# Patient Record
Sex: Male | Born: 1970 | Race: White | Hispanic: No | Marital: Married | Smoking: Former smoker
Health system: Southern US, Community
[De-identification: ages and names within clinical notes are randomized; demographics above are authoritative.]

## PROBLEM LIST (undated history)

## (undated) DIAGNOSIS — G8929 Other chronic pain: Secondary | ICD-10-CM

## (undated) DIAGNOSIS — G4730 Sleep apnea, unspecified: Secondary | ICD-10-CM

## (undated) DIAGNOSIS — M545 Low back pain: Secondary | ICD-10-CM

## (undated) HISTORY — PX: TYMPANOSTOMY TUBE PLACEMENT: SHX32

## (undated) HISTORY — PX: HERNIA REPAIR: SHX51

---

## 2004-06-02 ENCOUNTER — Encounter: Admission: RE | Admit: 2004-06-02 | Discharge: 2004-06-02 | Payer: Self-pay | Admitting: Family Medicine

## 2010-01-18 ENCOUNTER — Ambulatory Visit: Payer: Self-pay | Admitting: Internal Medicine

## 2010-01-19 ENCOUNTER — Telehealth (INDEPENDENT_AMBULATORY_CARE_PROVIDER_SITE_OTHER): Payer: Self-pay | Admitting: *Deleted

## 2010-12-15 NOTE — Assessment & Plan Note (Signed)
Summary: NEW PT EST // RS/pt rsc/cjr   Vital Signs:  Patient profile:   40 year old male Height:      73.5 inches Weight:      221 pounds BMI:     28.87 Temp:     98.6 degrees F oral BP sitting:   100 / 70  (right arm) Cuff size:   regular  Vitals Entered By: Duard Brady LPN (January 18, 6577 2:58 PM) CC: new pt to establish - c/o lumps under skin (L) mid back and (L) upper abd areas Is Patient Diabetic? No   CC:  new pt to establish - c/o lumps under skin (L) mid back and (L) upper abd areas.  History of Present Illness: 40 year old patient who is in today to establish what our practice.; he has enjoyed excellent health and has had some mild dermatologic concerns.  His past medical history is unremarkable.  Hehas had no hospital admissions.  He takes no chronic medications.  Preventive Screening-Counseling & Management  Alcohol-Tobacco     Smoking Status: never  Allergies (verified): 1)  ! Penicillin  Past History:  Past Medical History: unremarkable  Past Surgical History: tympanoplasty tubes as a child  Family History: Reviewed history and no changes required. father age 57.  History colonic polyps on chronic Coumadin mother, age 44, history of lupus also on chronic Coumadin one brother in good health one sister in good health-status post resection of a benign brain tumor at an early age  Social History: Reviewed history and no changes required. 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  The patient denies anorexia, fever, weight loss, weight gain, vision loss, decreased hearing, hoarseness, chest pain, syncope, dyspnea on exertion, peripheral edema, prolonged cough, headaches, hemoptysis, abdominal pain, melena, hematochezia, severe  indigestion/heartburn, hematuria, incontinence, genital sores, muscle weakness, transient blindness, difficulty walking, depression, unusual weight change, abnormal bleeding, enlarged lymph nodes, angioedema, breast masses, and testicular masses.    Physical Exam  General:  overweight-appearing.  blood pressure 100/70 Head:  Normocephalic and atraumatic without obvious abnormalities. No apparent alopecia or balding. Eyes:  No corneal or conjunctival inflammation noted. EOMI. Perrla. Funduscopic exam benign, without hemorrhages, exudates or papilledema. Vision grossly normal. Ears:  External ear exam shows no significant lesions or deformities.  Otoscopic examination reveals clear canals, tympanic membranes are intact bilaterally without bulging, retraction, inflammation or discharge. Hearing is grossly normal bilaterally. Nose:  External nasal examination shows no deformity or inflammation. Nasal mucosa are pink and moist without lesions or exudates. Mouth:  Oral mucosa and oropharynx without lesions or exudates.  Teeth in good repair. Neck:  No deformities, masses, or tenderness noted. Chest Wall:  No deformities, masses, tenderness or gynecomastia noted. Breasts:  No masses or gynecomastia noted Lungs:  Normal respiratory effort, chest expands symmetrically. Lungs are clear to auscultation, no crackles or wheezes. Heart:  Normal rate and regular rhythm. S1 and S2 normal without gallop, murmur, click, rub or other extra sounds. Abdomen:  Bowel sounds positive,abdomen soft and non-tender without masses, organomegaly or hernias noted. Genitalia:  Testes bilaterally descended without nodularity, tenderness or masses. No scrotal masses or lesions. No penis lesions or urethral discharge. Msk:  No deformity or scoliosis noted of thoracic or lumbar spine.  Pulses:  R and L carotid,radial,femoral,dorsalis pedis and posterior tibial pulses are full and equal bilaterally Extremities:  No clubbing,  cyanosis, edema, or deformity noted with normal full range of motion of all joints.   Neurologic:  No cranial nerve deficits noted. Station and gait are normal. Plantar reflexes are down-going bilaterally. DTRs are symmetrical throughout. Sensory, motor and coordinative functions appear intact. Skin:  a pigmented vascular nevus in the left anterior chest wall area near the costal margin; a few scattered subcutaneous nodules, consistent with small lipomas or possibly sebaceous cysts Axillary Nodes:  No palpable lymphadenopathy Inguinal Nodes:  No significant adenopathy Psych:  Cognition and judgment appear intact. Alert and cooperative with normal attention span and concentration. No apparent delusions, illusions, hallucinations   Impression & Recommendations:  Problem # 1:  Preventive Health Care (ICD-V70.0)  Orders: Urology Referral (Urology)  Patient Instructions: 1)  It is important that you exercise regularly at least 20 minutes 5 times a week. If you develop chest pain, have severe difficulty breathing, or feel very tired , stop exercising immediately and seek medical attention. 2)  You need to lose weight. Consider a lower calorie diet and regular exercise.  3)  return at age 7 for an annual exam

## 2010-12-15 NOTE — Progress Notes (Signed)
Summary: call no answer 3-9  Phone Note Call from Patient Call back at [phone]   Summary of Call: His wife says she took a call that he is to schedule a followup with Dr. Kirtland Bouchard.  What has changed?  He was told when he was here that he didn't need to come back until age 40. Initial call taken by: Rudy Jew, RN,  January 19, 2010 3:23 PM  Follow-up for Phone Call        ?phone off.  Call does not complete. Rudy Jew, RN  January 20, 2010 12:05 PM Mailbox full.  Entered office # to return call. Rudy Jew, RN  January 20, 2010 2:50 PM      ????? Wife is correct; ROV age 59 or as needed

## 2012-01-18 ENCOUNTER — Other Ambulatory Visit: Payer: Self-pay | Admitting: Internal Medicine

## 2012-01-25 ENCOUNTER — Ambulatory Visit
Admission: RE | Admit: 2012-01-25 | Discharge: 2012-01-25 | Disposition: A | Discharge status: Home / Self Care | Payer: 59 | Source: Ambulatory Visit | Attending: Internal Medicine | Admitting: Internal Medicine

## 2012-02-15 ENCOUNTER — Other Ambulatory Visit: Payer: Self-pay | Admitting: Internal Medicine

## 2012-02-15 DIAGNOSIS — R1011 Right upper quadrant pain: Secondary | ICD-10-CM

## 2012-02-20 ENCOUNTER — Other Ambulatory Visit: Payer: 59

## 2012-02-22 ENCOUNTER — Ambulatory Visit
Admission: RE | Admit: 2012-02-22 | Discharge: 2012-02-22 | Disposition: A | Discharge status: Home / Self Care | Payer: 59 | Source: Ambulatory Visit | Attending: Internal Medicine | Admitting: Internal Medicine

## 2012-02-22 DIAGNOSIS — R1011 Right upper quadrant pain: Secondary | ICD-10-CM

## 2012-02-22 MED ORDER — IOHEXOL 300 MG/ML  SOLN
125.0000 mL | Freq: Once | INTRAMUSCULAR | Status: AC | PRN
  Administered 2012-02-22: 125 mL via INTRAVENOUS

## 2012-11-30 ENCOUNTER — Encounter (INDEPENDENT_AMBULATORY_CARE_PROVIDER_SITE_OTHER): Payer: Self-pay

## 2012-12-04 ENCOUNTER — Encounter (INDEPENDENT_AMBULATORY_CARE_PROVIDER_SITE_OTHER): Payer: Self-pay | Admitting: Surgery

## 2012-12-04 ENCOUNTER — Ambulatory Visit (INDEPENDENT_AMBULATORY_CARE_PROVIDER_SITE_OTHER): Payer: 59 | Admitting: Surgery

## 2012-12-04 VITALS — BP 130/68 | HR 80 | Temp 97.4°F | Resp 16 | Ht 74.0 in | Wt 240.8 lb

## 2012-12-04 DIAGNOSIS — K420 Umbilical hernia with obstruction, without gangrene: Secondary | ICD-10-CM | POA: Insufficient documentation

## 2012-12-04 NOTE — Patient Instructions (Signed)
[facility]  HERNIA REPAIR POST OP INSTRUCTIONS  Always review your discharge instruction sheet given to you by the facility where your surgery was performed.  1. A  prescription for pain medication may be given to you upon discharge.  Take your pain medication as prescribed.  If narcotic pain medicine is not needed, then you may take acetaminophen (Tylenol) or ibuprofen (Advil) as needed.  2. Take your usually prescribed medications unless otherwise directed.  3. If you need a refill on your pain medication, please contact your pharmacy.  They will contact our office to request authorization. Prescriptions will not be filled after 5 pm daily or on weekends.  4. You should follow a light diet the first 24 hours after arrival home, such as soup and crackers or toast.  Be sure to include plenty of fluids daily.  Resume your normal diet the day after surgery.  5. Most patients will experience some swelling and bruising around the surgical site.  Ice packs and reclining will help.  Swelling and bruising can take several days to resolve.   6. It is common to experience some constipation if taking pain medication after surgery.  Increasing fluid intake and taking a stool softener (such as Colace) will usually help or prevent this problem from occurring.  A mild laxative (Milk of Magnesia or Miralax) should be taken according to package directions if there are no bowel movements after 48 hours.  7. Unless discharge instructions indicate otherwise, you may remove your bandages 24-48 hours after surgery, and you may shower at that time.  You may have steri-strips (small skin tapes) in place directly over the incision.  These strips should be left on the skin for 7-10 days.  If your surgeon used skin glue on the incision, you may shower in 24 hours.  The glue will flake off over the next 2-3 weeks.  Any sutures or staples will be removed at the office during your follow-up  visit.  8. ACTIVITIES:  You may resume regular (light) daily activities beginning the next day-such as daily self-care, walking, climbing stairs-gradually increasing activities as tolerated.  You may have sexual intercourse when it is comfortable.  Refrain from any heavy lifting or straining until approved by your doctor.  You may drive when you are no longer taking prescription pain medication, you can comfortably wear a seatbelt, and you can safely maneuver your car and apply brakes.  9. You should see your doctor in the office for a follow-up appointment approximately 2-3 weeks after your surgery.  Make sure that you call for this appointment within a day or two after you arrive home to insure a convenient appointment time. 10.   WHEN TO CALL YOUR DOCTOR: 1. Fever greater than 101.0 2. Inability to urinate 3. Persistent nausea and/or vomiting 4. Extreme swelling or bruising 5. Continued bleeding from incision 6. Increased pain, redness, or drainage from the incision  The clinic staff is available to answer your questions during regular business hours.  Please don't hesitate to call and ask to speak to one of the nurses for clinical concerns.  If you have a medical emergency, go to the nearest emergency room or call 911.  A surgeon from [facility] is always on call for the hospital.   [facility] [address], [address], [place], [place]  [address]  [phone] ? [phone] ? FAX [phone]  [email or web address]   

## 2012-12-04 NOTE — Progress Notes (Signed)
General Surgery [facility]  Chief Complaint  Patient presents with  . New Evaluation    evaluate possible umbilical hernia - referral from Dr. Robley Fries    HISTORY: Patient is a 42 year old white male referred by his primary care physician for evaluation of umbilical pain and suspected umbilical hernia. Patient notes that he has had pain for approximately 3 years. It is essentially constant. It becomes severe with palpation or manipulation. Patient denies any history of trauma or lifting injury. He has had no prior abdominal surgery. He denies any changes in bowel habits. He denies any drainage from the umbilicus. Patient does note a bulge just above the level of the umbilicus.  Past Medical History  Diagnosis Date  . Back pain      Current Outpatient Prescriptions  Medication Sig Dispense Refill  . ibuprofen (ADVIL,MOTRIN) 200 MG tablet Take 200 mg by mouth every 6 (six) hours as needed.         Allergies  Allergen Reactions  . Penicillins      Family History  Problem Relation Age of Onset  . Cancer Paternal Grandmother     lung  . Cancer Paternal Grandfather     colon     History   Social History  . Marital Status: Married    Spouse Name: N/A    Number of Children: N/A  . Years of Education: N/A   Social History Main Topics  . Smoking status: Former Games developer  . Smokeless tobacco: Never Used  . Alcohol Use: 6.0 oz/week    10 Glasses of wine per week  . Drug Use: No  . Sexually Active: None   Other Topics Concern  . None   Social History Narrative  . None     REVIEW OF SYSTEMS - PERTINENT POSITIVES ONLY: Moderate pain, exacerbated by manipulation. Denies drainage. Denies changes in bowel habits. Visible bulge present on occasion.  EXAM: Filed Vitals:   12/04/12 1335  BP: 130/68  Pulse: 80  Temp: 97.4 F (36.3 C)  Resp: 16    HEENT: normocephalic; pupils equal and reactive; sclerae clear; dentition good; mucous membranes  moist NECK:  symmetric on extension; no palpable anterior or posterior cervical lymphadenopathy; no supraclavicular masses; no tenderness CHEST: clear to auscultation bilaterally without rales, rhonchi, or wheezes CARDIAC: regular rate and rhythm without significant murmur; peripheral pulses are full ABDOMEN: soft without distension; bowel sounds present; no mass; no hepatosplenomegaly; deformity at the umbilicus with palpable incarcerated adipose tissue consistent with incarcerated umbilical hernia; moderate tenderness to palpation; moderate rectus diastases; small hemangioma left upper quadrant less than 5 mm in diameter EXT:  non-tender without edema; no deformity NEURO: no gross focal deficits; no sign of tremor   LABORATORY RESULTS: See [facility] (CHL-Epic) for most recent results   RADIOLOGY RESULTS: See [facility] (CHL-Epic) for most recent results   IMPRESSION: #1 incarcerated umbilical hernia containing omentum, symptomatic #2  5 mm hemangioma left upper quadrant abdominal wall  PLAN: I discussed with the patient the indications for repair of his umbilical hernia. I provided him with written literature to review. We discussed the use of prosthetic mesh. We discussed or strictures on his activities following the procedure. I explained to him that there is a less than 5% chance of recurrence. We discussed potential complications including the risk of infection. He understands and wishes to proceed. He would like to have the hemangioma on the abdominal wall excised concurrently. We will schedule him for outpatient  surgery in the near future.  The risks and benefits of the procedure have been discussed at length with the patient.  The patient understands the proposed procedure, potential alternative treatments, and the course of recovery to be expected.  All of the patient's questions have been answered at this time.  The patient wishes to proceed with surgery.  [name], MD, FACS General & Endocrine Surgery [facility]   Visit Diagnoses: 1. Umbilical hernia, incarcerated     Primary Care Physician: Pearla Dubonnet, MD

## 2014-06-03 ENCOUNTER — Encounter (INDEPENDENT_AMBULATORY_CARE_PROVIDER_SITE_OTHER): Payer: Self-pay | Admitting: Surgery

## 2014-06-03 ENCOUNTER — Ambulatory Visit (INDEPENDENT_AMBULATORY_CARE_PROVIDER_SITE_OTHER): Payer: 59 | Admitting: Surgery

## 2014-06-03 VITALS — BP 136/80 | HR 80 | Temp 97.1°F | Ht 74.0 in | Wt 244.0 lb

## 2014-06-03 DIAGNOSIS — K420 Umbilical hernia with obstruction, without gangrene: Secondary | ICD-10-CM

## 2014-06-03 NOTE — Progress Notes (Signed)
General Surgery [facility]  Chief Complaint  Patient presents with  . Follow-up    incarcerated umbilical hernia - evaluated in Jan 2014 - referral from Dr. Robley FriesNevill Gates    HISTORY: Patient is a 43 year old male previously evaluated for incarcerated umbilical hernia. Due to a series of events he never came to surgical repair. Patient returns today for assessment and scheduling of his procedure.  PERTINENT REVIEW OF SYSTEMS: Over the past year the patient has noted gradual enlargement of the hernia with more frequent discomfort.  EXAM: HEENT: normocephalic; pupils equal and reactive; sclerae clear; dentition good; mucous membranes moist NECK:  No palpable masses in the thyroid bed; symmetric on extension; no palpable anterior or posterior cervical lymphadenopathy; no supraclavicular masses; no tenderness CHEST: clear to auscultation bilaterally without rales, rhonchi, or wheezes CARDIAC: regular rate and rhythm without significant murmur; peripheral pulses are full ABD:  Soft without distention; obvious umbilical hernia, not reducible, tender to manipulation; there is a small hemangioma at the left costal margin in the left upper quadrant of the abdomen measuring approximately 4 mm in diameter EXT:  non-tender without edema; no deformity NEURO: no gross focal deficits; no sign of tremor   IMPRESSION: #1 incarcerated umbilical hernia #2 hemangioma left costal margin  PLAN: Patient with like to proceed with umbilical hernia repair. We discussed repair with mesh. We discussed restrictions on his activities following the procedure. We discussed the possibility of recurrence being approximately 5%. This can be done as an outpatient surgical procedure.  Patient would also like the small hemangioma at the left costal margin excised. We can do this concurrently.  The risks and benefits of the procedure have been discussed at length with the patient.  The patient  understands the proposed procedure, potential alternative treatments, and the course of recovery to be expected.  All of the patient's questions have been answered at this time.  The patient wishes to proceed with surgery.  Velora Hecklerodd M. Talik Casique, MD, [facility] Office: [phone]  Visit Diagnoses: 1. Umbilical hernia, incarcerated

## 2014-06-03 NOTE — Patient Instructions (Signed)
[facility]  HERNIA REPAIR POST OP INSTRUCTIONS  Always review your discharge instruction sheet given to you by the facility where your surgery was performed.  1. A  prescription for pain medication may be given to you upon discharge.  Take your pain medication as prescribed.  If narcotic pain medicine is not needed, then you may take acetaminophen (Tylenol) or ibuprofen (Advil) as needed.  2. Take your usually prescribed medications unless otherwise directed.  3. If you need a refill on your pain medication, please contact your pharmacy.  They will contact our office to request authorization. Prescriptions will not be filled after 5 pm daily or on weekends.  4. You should follow a light diet the first 24 hours after arrival home, such as soup and crackers or toast.  Be sure to include plenty of fluids daily.  Resume your normal diet the day after surgery.  5. Most patients will experience some swelling and bruising around the surgical site.  Ice packs and reclining will help.  Swelling and bruising can take several days to resolve.   6. It is common to experience some constipation if taking pain medication after surgery.  Increasing fluid intake and taking a stool softener (such as Colace) will usually help or prevent this problem from occurring.  A mild laxative (Milk of Magnesia or Miralax) should be taken according to package directions if there are no bowel movements after 48 hours.  7. Unless discharge instructions indicate otherwise, you may remove your bandages 24-48 hours after surgery, and you may shower at that time.  You may have steri-strips (small skin tapes) in place directly over the incision.  These strips should be left on the skin for 7-10 days.  If your surgeon used skin glue on the incision, you may shower in 24 hours.  The glue will flake off over the next 2-3 weeks.  Any sutures or staples will be removed at the office during your follow-up  visit.  8. ACTIVITIES:  You may resume regular (light) daily activities beginning the next day-such as daily self-care, walking, climbing stairs-gradually increasing activities as tolerated.  You may have sexual intercourse when it is comfortable.  Refrain from any heavy lifting or straining until approved by your doctor.  You may drive when you are no longer taking prescription pain medication, you can comfortably wear a seatbelt, and you can safely maneuver your car and apply brakes.  9. You should see your doctor in the office for a follow-up appointment approximately 2-3 weeks after your surgery.  Make sure that you call for this appointment within a day or two after you arrive home to insure a convenient appointment time. 10.   WHEN TO CALL YOUR DOCTOR: 1. Fever greater than 101.0 2. Inability to urinate 3. Persistent nausea and/or vomiting 4. Extreme swelling or bruising 5. Continued bleeding from incision 6. Increased pain, redness, or drainage from the incision  The clinic staff is available to answer your questions during regular business hours.  Please don't hesitate to call and ask to speak to one of the nurses for clinical concerns.  If you have a medical emergency, go to the nearest emergency room or call 911.  A surgeon from [facility] is always on call for the hospital.   [facility] [address], [address], [place], [place]  [address]  [phone] ? [phone] ? FAX [phone]  [email or web address]   

## 2014-09-01 ENCOUNTER — Emergency Department (HOSPITAL_COMMUNITY)
Admission: EM | Admit: 2014-09-01 | Discharge: 2014-09-01 | Disposition: A | Discharge status: Home / Self Care | Payer: No Typology Code available for payment source | Attending: Emergency Medicine | Admitting: Emergency Medicine

## 2014-09-01 ENCOUNTER — Encounter (HOSPITAL_COMMUNITY): Payer: Self-pay | Admitting: Emergency Medicine

## 2014-09-01 ENCOUNTER — Emergency Department (HOSPITAL_COMMUNITY): Payer: No Typology Code available for payment source

## 2014-09-01 DIAGNOSIS — Z87891 Personal history of nicotine dependence: Secondary | ICD-10-CM | POA: Diagnosis not present

## 2014-09-01 DIAGNOSIS — Z8639 Personal history of other endocrine, nutritional and metabolic disease: Secondary | ICD-10-CM | POA: Diagnosis not present

## 2014-09-01 DIAGNOSIS — R079 Chest pain, unspecified: Secondary | ICD-10-CM | POA: Diagnosis present

## 2014-09-01 DIAGNOSIS — R0789 Other chest pain: Secondary | ICD-10-CM | POA: Diagnosis not present

## 2014-09-01 DIAGNOSIS — Z880 Allergy status to penicillin: Secondary | ICD-10-CM | POA: Diagnosis not present

## 2014-09-01 DIAGNOSIS — E669 Obesity, unspecified: Secondary | ICD-10-CM | POA: Insufficient documentation

## 2014-09-01 LAB — CBC WITH DIFFERENTIAL/PLATELET
BASOS PCT: 0 % (ref 0–1)
Basophils Absolute: 0 10*3/uL (ref 0.0–0.1)
EOS ABS: 0.2 10*3/uL (ref 0.0–0.7)
Eosinophils Relative: 3 % (ref 0–5)
HEMATOCRIT: 43 % (ref 39.0–52.0)
HEMOGLOBIN: 15.4 g/dL (ref 13.0–17.0)
LYMPHS ABS: 1.9 10*3/uL (ref 0.7–4.0)
Lymphocytes Relative: 27 % (ref 12–46)
MCH: 32 pg (ref 26.0–34.0)
MCHC: 35.8 g/dL (ref 30.0–36.0)
MCV: 89.2 fL (ref 78.0–100.0)
Monocytes Absolute: 0.5 10*3/uL (ref 0.1–1.0)
Monocytes Relative: 8 % (ref 3–12)
NEUTROS ABS: 4.3 10*3/uL (ref 1.7–7.7)
Neutrophils Relative %: 62 % (ref 43–77)
Platelets: 248 10*3/uL (ref 150–400)
RBC: 4.82 MIL/uL (ref 4.22–5.81)
RDW: 12.1 % (ref 11.5–15.5)
WBC: 6.9 10*3/uL (ref 4.0–10.5)

## 2014-09-01 LAB — BASIC METABOLIC PANEL
ANION GAP: 11 (ref 5–15)
BUN: 18 mg/dL (ref 6–23)
CO2: 26 meq/L (ref 19–32)
Calcium: 9.1 mg/dL (ref 8.4–10.5)
Chloride: 102 mEq/L (ref 96–112)
Creatinine, Ser: 1.11 mg/dL (ref 0.50–1.35)
GFR, EST NON AFRICAN AMERICAN: 80 mL/min — AB (ref >90)
Glucose, Bld: 103 mg/dL — ABNORMAL HIGH (ref 70–99)
Potassium: 4.2 mEq/L (ref 3.7–5.3)
Sodium: 139 mEq/L (ref 137–147)

## 2014-09-01 LAB — I-STAT TROPONIN, ED
Troponin i, poc: 0 ng/mL (ref 0.00–0.08)
Troponin i, poc: 0 ng/mL (ref 0.00–0.08)

## 2014-09-01 LAB — D-DIMER, QUANTITATIVE (NOT AT ARMC)

## 2014-09-01 MED ORDER — KETOROLAC TROMETHAMINE 30 MG/ML IJ SOLN
30.0000 mg | Freq: Once | INTRAMUSCULAR | Status: AC
  Administered 2014-09-01: 30 mg via INTRAVENOUS
  Filled 2014-09-01: qty 1

## 2014-09-01 NOTE — ED Provider Notes (Signed)
CSN: [id number]     Arrival date & time 09/01/14  1002 History   First MD Initiated Contact with Patient 09/01/14 1005     Chief Complaint  Patient presents with  . Chest Pain     (Consider location/radiation/quality/duration/timing/severity/associated sxs/prior Treatment) Patient is a 43 y.o. male presenting with chest pain.  Chest Pain Pain location:  Substernal area Pain quality: sharp   Pain radiates to:  Does not radiate Pain radiates to the back: no   Pain severity:  Moderate Onset quality:  Unable to specify Duration:  3 hours Timing:  Constant Progression:  Unchanged Chronicity:  New Context: breathing   Relieved by:  None tried Worsened by:  Deep breathing Ineffective treatments:  None tried Associated symptoms: no abdominal pain, no cough, no diaphoresis, no fatigue, no fever, no headache, no heartburn, no lower extremity edema, no nausea, no shortness of breath, no syncope and not vomiting   Risk factors: high cholesterol, male sex and obesity   Risk factors: no diabetes mellitus, no hypertension and no prior DVT/PE     Past Medical History  Diagnosis Date  . Back pain    Past Surgical History  Procedure Laterality Date  . Tympanostomy tube placement     Family History  Problem Relation Age of Onset  . Cancer Paternal Grandmother     lung  . Cancer Paternal Grandfather     colon   History  Substance Use Topics  . Smoking status: Former Games developermoker  . Smokeless tobacco: Never Used  . Alcohol Use: 6.0 oz/week    10 Glasses of wine per week    Review of Systems  Constitutional: Negative for fever, chills, diaphoresis and fatigue.  HENT: Negative for congestion and sore throat.   Eyes: Negative for visual disturbance.  Respiratory: Negative for cough, shortness of breath and wheezing.   Cardiovascular: Positive for chest pain. Negative for syncope.  Gastrointestinal: Negative for heartburn, nausea, vomiting, abdominal pain, diarrhea and constipation.   Genitourinary: Negative for dysuria and difficulty urinating.  Musculoskeletal: Negative for arthralgias and myalgias.  Skin: Negative for wound.  Neurological: Negative for syncope and headaches.  Psychiatric/Behavioral: Negative for behavioral problems.  All other systems reviewed and are negative.     Allergies  Penicillins  Home Medications   Prior to Admission medications   Medication Sig Start Date End Date Taking? Authorizing Provider  cetirizine (ZYRTEC) 10 MG tablet Take 10 mg by mouth daily as needed for allergies.   Yes Historical Provider, MD  ibuprofen (ADVIL,MOTRIN) 200 MG tablet Take 200 mg by mouth every 6 (six) hours as needed for fever, headache or mild pain.    Yes Historical Provider, MD   BP 128/76  Pulse 71  Temp(Src) 97.3 F (36.3 C) (Oral)  Resp 20  Ht 6' 1.5" (1.867 m)  Wt 242 lb (109.77 kg)  BMI 31.49 kg/m2  SpO2 99% Physical Exam  Constitutional: He is oriented to person, place, and time. He appears well-developed and well-nourished.  HENT:  Head: Normocephalic and atraumatic.  Eyes: EOM are normal.  Neck: Normal range of motion.  Cardiovascular: Normal rate, regular rhythm, normal heart sounds and intact distal pulses.   No murmur heard. Pulmonary/Chest: Effort normal and breath sounds normal. No respiratory distress.  Abdominal: Soft. There is no tenderness.  Musculoskeletal: He exhibits no edema.  Neurological: He is alert and oriented to person, place, and time.  Skin: No rash noted. He is not diaphoretic.    ED Course  Procedures (including  critical care time) Labs Review Labs Reviewed  BASIC METABOLIC PANEL - Abnormal; Notable for the following:    Glucose, Bld 103 (*)    GFR calc non Af Amer 80 (*)    All other components within normal limits  CBC WITH DIFFERENTIAL  D-DIMER, QUANTITATIVE  I-STAT TROPOININ, ED  Rosezena SensorI-STAT TROPOININ, ED    Imaging Review Dg Chest 2 View  09/01/2014   CLINICAL DATA:  Chest pain since this  morning  EXAM: CHEST  2 VIEW  COMPARISON:  None.  FINDINGS: The heart size and mediastinal contours are within normal limits. There is no focal infiltrate, pulmonary edema, or pleural effusion P The visualized skeletal structures are unremarkable.  IMPRESSION: No active cardiopulmonary disease.   Electronically Signed   By: Sherian ReinWei-Chen  Lin M.D.   On: 09/01/2014 11:41     EKG Interpretation   Date/Time:  Monday September 01 2014 10:05:23 EDT Ventricular Rate:  66 PR Interval:  172 QRS Duration: 76 QT Interval:  406 QTC Calculation: 425 R Axis:   18 Text Interpretation:  Normal sinus rhythm Normal ECG Confirmed by Lionell Matuszak MD,  Duwayne HeckANIELLE ([id number]) on 09/01/2014 4:02:37 PM      MDM   Final diagnoses:  Chest wall pain    Patient is a 43 year old male that presents with substernal sharp chest pain that started this morning. Pain is pleuritic in nature. Patient states a history of high cholesterol otherwise no risk factors no family history of coronary artery disease. Patient is a Wells low risk perc negative. Patient has a low heart score. Patient has equal pulses bilaterally with no murmur therefore aortic dissection this very unlikely. Patient's EKG is normal sinus rhythm without evidence of ischemia or arrhythmia. Will get chest x-Renette Hsu and labs, likely safe for discharge home if normal workup. Patient's workup is negative. Patient had improvement of pain. Will discharge patient with NSAIDs. Patient followup with PCP if symptoms persist.    Beverely Risenennis Carr, MD 09/01/14 1332  Hilario Quarryanielle S Jacksyn Beeks, MD 09/02/14 [phone]

## 2014-09-01 NOTE — ED Notes (Signed)
Pt c/o mid sternal to left sided CP with radiation to left arm starting today; pt denies SOB

## 2014-09-01 NOTE — Discharge Instructions (Signed)

## 2015-06-04 ENCOUNTER — Other Ambulatory Visit: Payer: Self-pay | Admitting: Surgery

## 2015-06-04 DIAGNOSIS — R1013 Epigastric pain: Secondary | ICD-10-CM

## 2015-06-04 DIAGNOSIS — R109 Unspecified abdominal pain: Secondary | ICD-10-CM

## 2015-06-10 ENCOUNTER — Ambulatory Visit
Admission: RE | Admit: 2015-06-10 | Discharge: 2015-06-10 | Disposition: A | Discharge status: Home / Self Care | Payer: No Typology Code available for payment source | Source: Ambulatory Visit | Attending: Surgery | Admitting: Surgery

## 2015-06-10 MED ORDER — IOPAMIDOL (ISOVUE-300) INJECTION 61%
125.0000 mL | Freq: Once | INTRAVENOUS | Status: AC | PRN
  Administered 2015-06-10: 125 mL via INTRAVENOUS

## 2015-09-16 ENCOUNTER — Ambulatory Visit: Payer: Self-pay | Admitting: Surgery

## 2015-10-28 ENCOUNTER — Encounter (HOSPITAL_COMMUNITY): Payer: Self-pay

## 2015-10-28 ENCOUNTER — Encounter (HOSPITAL_COMMUNITY): Payer: Self-pay | Admitting: Surgery

## 2015-10-28 ENCOUNTER — Encounter (HOSPITAL_COMMUNITY)
Admission: RE | Admit: 2015-10-28 | Discharge: 2015-10-28 | Disposition: A | Discharge status: Home / Self Care | Payer: No Typology Code available for payment source | Source: Ambulatory Visit | Attending: Surgery | Admitting: Surgery

## 2015-10-28 DIAGNOSIS — K439 Ventral hernia without obstruction or gangrene: Secondary | ICD-10-CM | POA: Insufficient documentation

## 2015-10-28 DIAGNOSIS — Z01812 Encounter for preprocedural laboratory examination: Secondary | ICD-10-CM | POA: Insufficient documentation

## 2015-10-28 DIAGNOSIS — K432 Incisional hernia without obstruction or gangrene: Secondary | ICD-10-CM | POA: Diagnosis present

## 2015-10-28 LAB — CBC
HEMATOCRIT: 46.2 % (ref 39.0–52.0)
Hemoglobin: 16.3 g/dL (ref 13.0–17.0)
MCH: 32.2 pg (ref 26.0–34.0)
MCHC: 35.3 g/dL (ref 30.0–36.0)
MCV: 91.3 fL (ref 78.0–100.0)
Platelets: 253 10*3/uL (ref 150–400)
RBC: 5.06 MIL/uL (ref 4.22–5.81)
RDW: 12.1 % (ref 11.5–15.5)
WBC: 7.5 10*3/uL (ref 4.0–10.5)

## 2015-10-28 NOTE — Pre-Procedure Instructions (Signed)
Colin RossettiDaniel J Contreras  10/28/2015      [facility] [id number] [name], [place] [address] [facility] [address] [facility]  [place] [id number] Phone: [phone] Fax: [phone]    Your procedure is scheduled on  Monday  11/02/15  [facility].M.  Call this number if you have problems the morning of surgery:  [phone]   Remember:  Do not eat food or drink liquids after midnight.  Take these medicines the morning of surgery with A SIP OF WATER   ZYRTEC  (STOP IBUPROFEN/ ADVIL/ MOTRIN, ASPIRIN, COUMADIN, PLAVIX, EFFIENT, HERBAL MEDICINES, VITAMINS, GOODY POWDERS/ BC'S)   Do not wear jewelry, make-up or nail polish.  Do not wear lotions, powders, or perfumes.  You may wear deodorant.  Do not shave 48 hours prior to surgery.  Men may shave face and neck.  Do not bring valuables to the hospital.  [facility] is not responsible for any belongings or valuables.  Contacts, dentures or bridgework may not be worn into surgery.  Leave your suitcase in the car.  After surgery it may be brought to your room.  For patients admitted to the hospital, discharge time will be determined by your treatment team.  Patients discharged the day of surgery will not be allowed to drive home.   Name and phone number of your driver:   Special instructions:  [place] - Preparing for Surgery  Before surgery, you can play an important role.  Because skin is not sterile, your skin needs to be as free of germs as possible.  You can reduce the number of germs on you skin by washing with CHG (chlorahexidine gluconate) soap before surgery.  CHG is an antiseptic cleaner which kills germs and bonds with the skin to continue killing germs even after washing.  Please DO NOT use if you have an allergy to CHG or antibacterial soaps.  If your skin becomes reddened/irritated stop using the CHG and inform your nurse when you arrive at Short  Stay.  Do not shave (including legs and underarms) for at least 48 hours prior to the first CHG shower.  You may shave your face.  Please follow these instructions carefully:   1.  Shower with CHG Soap the night before surgery and the                                morning of Surgery.  2.  If you choose to wash your hair, wash your hair first as usual with your       normal shampoo.  3.  After you shampoo, rinse your hair and body thoroughly to remove the                      Shampoo.  4.  Use CHG as you would any other liquid soap.  You can apply chg directly       to the skin and wash gently with scrungie or a clean washcloth.  5.  Apply the CHG Soap to your body ONLY FROM THE NECK DOWN.        Do not use on open wounds or open sores.  Avoid contact with your eyes,       ears, mouth and genitals (private parts).  Wash genitals (private parts)  with your normal soap.  6.  Wash thoroughly, paying special attention to the area where your surgery        will be performed.  7.  Thoroughly rinse your body with warm water from the neck down.  8.  DO NOT shower/wash with your normal soap after using and rinsing off       the CHG Soap.  9.  Pat yourself dry with a clean towel.            10.  Wear clean pajamas.            11.  Place clean sheets on your bed the night of your first shower and do not        sleep with pets.  Day of Surgery  Do not apply any lotions/deoderants the morning of surgery.  Please wear clean clothes to the hospital/surgery center.    Please read over the following fact sheets that you were given. Pain Booklet, Coughing and Deep Breathing and Surgical Site Infection Prevention

## 2015-10-28 NOTE — H&P (Signed)
  General Surgery [facility]  Marda Stalkeraniel J. Zweber DOB: [birth year] Married / Language: [place] / Race: White Male  History of Present Illness  The patient is a 44 year old male presenting for a post-operative visit. Patient returns at my request to review the results of his CT scan from June 10, 2015. Patient had previously undergone repair of umbilical hernia with mesh patch. Unfortunately it appears that he is either developed a new hernia in the abdominal wall midline or he has had a recurrence at the superior margin of the mesh patch. On CT scan there is a approximately 4 cm hernia in the midline above the level of the umbilicus containing fatty tissue. It does not involve the bowel and there is no sign of obstruction.  Allergies Penicillins  Medication History ZyrTEC (10MG  Tablet, Oral) Active. Medications Reconciled  Vitals Weight: 260 lb Height: 74in Body Surface Area: 2.48 m Body Mass Index: 33.38 kg/m  Temp.: 98.25F(Oral)  Pulse: 77 (Regular)  Resp.: 18 (Unlabored)  BP: 132/72 (Sitting, Left Arm, Standard)  Physical Exam   Surgical incision below the umbilicus is well healed. Umbilicus remains inverted with no sign of recurrent umbilical hernia. There is a soft tissue mass in the midline approximately 4-5 cm above the level of the umbilicus consistent with ventral hernia. His mildly tender to palpation. It is not fully reducible.  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 We discussed the significance of these findings and the fact that this would require a second operative procedure. I have recommended laparoscopic ventral incisional hernia repair with mesh patch. We discussed the operation in the hospital stay to be anticipated. Patient would like to postpone surgery until December 2016. I think it is  safe to do so.  I will prescribed Naprosyn 500 mg twice daily for 2 weeks in hopes of improving his current mild discomfort.  Patient will contact our office November 1 to arrange for scheduling of his surgery in December.  The risks and benefits of the procedure have been discussed at length with the patient.  The patient understands the proposed procedure, potential alternative treatments, and the course of recovery to be expected.  All of the patient's questions have been answered at this time.  The patient wishes to proceed with surgery.  Velora Hecklerodd M. Annete Ayuso, MD, [facility] Office: [phone]

## 2015-11-01 MED ORDER — CIPROFLOXACIN IN D5W 400 MG/200ML IV SOLN
400.0000 mg | INTRAVENOUS | Status: AC
  Administered 2015-11-02: 400 mg via INTRAVENOUS
  Filled 2015-11-01: qty 200

## 2015-11-02 ENCOUNTER — Ambulatory Visit (HOSPITAL_COMMUNITY): Payer: No Typology Code available for payment source | Admitting: Anesthesiology

## 2015-11-02 ENCOUNTER — Encounter (HOSPITAL_COMMUNITY)
Admission: RE | Disposition: A | Discharge status: Home / Self Care | Payer: Self-pay | Source: Ambulatory Visit | Attending: Surgery

## 2015-11-02 ENCOUNTER — Encounter (HOSPITAL_COMMUNITY): Payer: Self-pay | Admitting: General Practice

## 2015-11-02 ENCOUNTER — Observation Stay (HOSPITAL_COMMUNITY)
Admission: RE | Admit: 2015-11-02 | Discharge: 2015-11-05 | Disposition: A | Discharge status: Home / Self Care | Payer: No Typology Code available for payment source | Source: Ambulatory Visit | Attending: Surgery | Admitting: Surgery

## 2015-11-02 DIAGNOSIS — K432 Incisional hernia without obstruction or gangrene: Secondary | ICD-10-CM | POA: Diagnosis present

## 2015-11-02 DIAGNOSIS — K430 Incisional hernia with obstruction, without gangrene: Principal | ICD-10-CM | POA: Insufficient documentation

## 2015-11-02 DIAGNOSIS — Z87891 Personal history of nicotine dependence: Secondary | ICD-10-CM | POA: Insufficient documentation

## 2015-11-02 HISTORY — DX: Low back pain: M54.5

## 2015-11-02 HISTORY — DX: Sleep apnea, unspecified: G47.30

## 2015-11-02 HISTORY — DX: Other chronic pain: G89.29

## 2015-11-02 HISTORY — PX: VENTRAL HERNIA REPAIR: SHX424

## 2015-11-02 SURGERY — REPAIR, HERNIA, VENTRAL, LAPAROSCOPIC
Anesthesia: General | Site: Abdomen

## 2015-11-02 MED ORDER — ONDANSETRON 4 MG PO TBDP: 4.0000 mg | ORAL_TABLET | Freq: Four times a day (QID) | ORAL | Status: DC | PRN

## 2015-11-02 MED ORDER — SODIUM CHLORIDE 0.9 % IV SOLN
10.0000 mg | INTRAVENOUS | Status: DC | PRN
  Administered 2015-11-02: 25 ug/min via INTRAVENOUS

## 2015-11-02 MED ORDER — EPHEDRINE SULFATE 50 MG/ML IJ SOLN
INTRAMUSCULAR | Status: DC | PRN
  Administered 2015-11-02: 10 mg via INTRAVENOUS
  Administered 2015-11-02: 5 mg via INTRAVENOUS
  Administered 2015-11-02 (×2): 10 mg via INTRAVENOUS

## 2015-11-02 MED ORDER — FENTANYL CITRATE (PF) 100 MCG/2ML IJ SOLN
INTRAMUSCULAR | Status: DC | PRN
  Administered 2015-11-02 (×2): 50 ug via INTRAVENOUS
  Administered 2015-11-02: 100 ug via INTRAVENOUS
  Administered 2015-11-02: 50 ug via INTRAVENOUS

## 2015-11-02 MED ORDER — GLYCOPYRROLATE 0.2 MG/ML IJ SOLN
INTRAMUSCULAR | Status: AC
  Filled 2015-11-02: qty 4

## 2015-11-02 MED ORDER — SUCCINYLCHOLINE CHLORIDE 20 MG/ML IJ SOLN
INTRAMUSCULAR | Status: DC | PRN
  Administered 2015-11-02: 100 mg via INTRAVENOUS

## 2015-11-02 MED ORDER — BUPIVACAINE HCL (PF) 0.25 % IJ SOLN
INTRAMUSCULAR | Status: AC
  Filled 2015-11-02: qty 30

## 2015-11-02 MED ORDER — LACTATED RINGERS IV SOLN
INTRAVENOUS | Status: DC
  Administered 2015-11-02 (×2): via INTRAVENOUS

## 2015-11-02 MED ORDER — DIPHENHYDRAMINE HCL 50 MG/ML IJ SOLN
25.0000 mg | Freq: Four times a day (QID) | INTRAMUSCULAR | Status: DC | PRN
  Administered 2015-11-02 – 2015-11-03 (×3): 25 mg via INTRAVENOUS
  Filled 2015-11-02 (×3): qty 1

## 2015-11-02 MED ORDER — PROPOFOL 10 MG/ML IV BOLUS
INTRAVENOUS | Status: DC | PRN
  Administered 2015-11-02: 200 mg via INTRAVENOUS

## 2015-11-02 MED ORDER — ONDANSETRON HCL 4 MG/2ML IJ SOLN: 4.0000 mg | Freq: Four times a day (QID) | INTRAMUSCULAR | Status: DC | PRN

## 2015-11-02 MED ORDER — FENTANYL CITRATE (PF) 250 MCG/5ML IJ SOLN
INTRAMUSCULAR | Status: AC
  Filled 2015-11-02: qty 5

## 2015-11-02 MED ORDER — LIDOCAINE HCL (CARDIAC) 20 MG/ML IV SOLN
INTRAVENOUS | Status: AC
  Filled 2015-11-02: qty 5

## 2015-11-02 MED ORDER — PROPOFOL 10 MG/ML IV BOLUS
INTRAVENOUS | Status: AC
  Filled 2015-11-02: qty 20

## 2015-11-02 MED ORDER — BUPIVACAINE HCL (PF) 0.25 % IJ SOLN
INTRAMUSCULAR | Status: DC | PRN
Start: 2015-11-02 — End: 2015-11-02
  Administered 2015-11-02: 30 mL

## 2015-11-02 MED ORDER — MIDAZOLAM HCL 2 MG/2ML IJ SOLN
INTRAMUSCULAR | Status: AC
  Filled 2015-11-02: qty 2

## 2015-11-02 MED ORDER — ROCURONIUM BROMIDE 50 MG/5ML IV SOLN
INTRAVENOUS | Status: AC
  Filled 2015-11-02: qty 1

## 2015-11-02 MED ORDER — 0.9 % SODIUM CHLORIDE (POUR BTL) OPTIME
TOPICAL | Status: DC | PRN
  Administered 2015-11-02: 1000 mL

## 2015-11-02 MED ORDER — HYDROMORPHONE HCL 1 MG/ML IJ SOLN: 0.5000 mg | INTRAMUSCULAR | Status: DC | PRN

## 2015-11-02 MED ORDER — EPHEDRINE SULFATE 50 MG/ML IJ SOLN
INTRAMUSCULAR | Status: AC
  Filled 2015-11-02: qty 2

## 2015-11-02 MED ORDER — ACETAMINOPHEN 650 MG RE SUPP: 650.0000 mg | Freq: Four times a day (QID) | RECTAL | Status: DC | PRN

## 2015-11-02 MED ORDER — GLYCOPYRROLATE 0.2 MG/ML IJ SOLN
INTRAMUSCULAR | Status: DC | PRN
  Administered 2015-11-02: 0.4 mg via INTRAVENOUS
  Administered 2015-11-02: .8 mg via INTRAVENOUS

## 2015-11-02 MED ORDER — HYDROMORPHONE HCL 1 MG/ML IJ SOLN
1.0000 mg | INTRAMUSCULAR | Status: DC | PRN
  Administered 2015-11-02 – 2015-11-03 (×6): 1 mg via INTRAVENOUS
  Filled 2015-11-02 (×7): qty 1

## 2015-11-02 MED ORDER — ONDANSETRON HCL 4 MG/2ML IJ SOLN
INTRAMUSCULAR | Status: AC
  Filled 2015-11-02: qty 2

## 2015-11-02 MED ORDER — ONDANSETRON HCL 4 MG/2ML IJ SOLN
INTRAMUSCULAR | Status: DC | PRN
  Administered 2015-11-02: 4 mg via INTRAVENOUS

## 2015-11-02 MED ORDER — ROCURONIUM BROMIDE 100 MG/10ML IV SOLN
INTRAVENOUS | Status: DC | PRN
  Administered 2015-11-02: 40 mg via INTRAVENOUS
  Administered 2015-11-02: 10 mg via INTRAVENOUS

## 2015-11-02 MED ORDER — HYDROMORPHONE HCL 1 MG/ML IJ SOLN
INTRAMUSCULAR | Status: AC
  Filled 2015-11-02: qty 1

## 2015-11-02 MED ORDER — MIDAZOLAM HCL 5 MG/5ML IJ SOLN
INTRAMUSCULAR | Status: DC | PRN
Start: 2015-11-02 — End: 2015-11-02
  Administered 2015-11-02: 2 mg via INTRAVENOUS

## 2015-11-02 MED ORDER — ACETAMINOPHEN 325 MG PO TABS
650.0000 mg | ORAL_TABLET | Freq: Four times a day (QID) | ORAL | Status: DC | PRN
  Administered 2015-11-03: 650 mg via ORAL
  Filled 2015-11-02: qty 2

## 2015-11-02 MED ORDER — KCL IN DEXTROSE-NACL 20-5-0.45 MEQ/L-%-% IV SOLN
INTRAVENOUS | Status: DC
  Administered 2015-11-02 – 2015-11-03 (×3): via INTRAVENOUS
  Filled 2015-11-02 (×4): qty 1000

## 2015-11-02 MED ORDER — LIDOCAINE HCL (CARDIAC) 20 MG/ML IV SOLN
INTRAVENOUS | Status: DC | PRN
  Administered 2015-11-02: 100 mg via INTRAVENOUS

## 2015-11-02 MED ORDER — GLYCOPYRROLATE 0.2 MG/ML IJ SOLN
INTRAMUSCULAR | Status: AC
  Filled 2015-11-02: qty 2

## 2015-11-02 MED ORDER — HYDROMORPHONE HCL 1 MG/ML IJ SOLN
0.2500 mg | INTRAMUSCULAR | Status: DC | PRN
  Administered 2015-11-02 (×2): 0.5 mg via INTRAVENOUS
  Administered 2015-11-02: 1 mg via INTRAVENOUS

## 2015-11-02 MED ORDER — NEOSTIGMINE METHYLSULFATE 10 MG/10ML IV SOLN
INTRAVENOUS | Status: AC
  Filled 2015-11-02: qty 1

## 2015-11-02 MED ORDER — OXYCODONE-ACETAMINOPHEN 5-325 MG PO TABS
ORAL_TABLET | ORAL | Status: AC
  Administered 2015-11-02: 2 via ORAL
  Filled 2015-11-02: qty 2

## 2015-11-02 MED ORDER — OXYCODONE-ACETAMINOPHEN 5-325 MG PO TABS
1.0000 | ORAL_TABLET | ORAL | Status: DC | PRN
  Administered 2015-11-02 – 2015-11-03 (×6): 2 via ORAL
  Administered 2015-11-04 (×3): 1 via ORAL
  Administered 2015-11-04 – 2015-11-05 (×3): 2 via ORAL
  Administered 2015-11-05: 1 via ORAL
  Filled 2015-11-02: qty 2
  Filled 2015-11-02: qty 1
  Filled 2015-11-02 (×2): qty 2
  Filled 2015-11-02 (×2): qty 1
  Filled 2015-11-02 (×6): qty 2

## 2015-11-02 MED ORDER — SODIUM CHLORIDE 0.9 % IJ SOLN
INTRAMUSCULAR | Status: AC
  Filled 2015-11-02: qty 20

## 2015-11-02 MED ORDER — NEOSTIGMINE METHYLSULFATE 10 MG/10ML IV SOLN
INTRAVENOUS | Status: DC | PRN
  Administered 2015-11-02: 4 mg via INTRAVENOUS

## 2015-11-02 SURGICAL SUPPLY — 56 items
APL SKNCLS STERI-STRIP NONHPOA (GAUZE/BANDAGES/DRESSINGS) ×1
APPLIER CLIP LOGIC TI 5 (MISCELLANEOUS) IMPLANT
APPLIER CLIP ROT 10 11.4 M/L (STAPLE)
APR CLP MED LRG 11.4X10 (STAPLE)
APR CLP MED LRG 33X5 (MISCELLANEOUS)
BENZOIN TINCTURE PRP APPL 2/3 (GAUZE/BANDAGES/DRESSINGS) ×3 IMPLANT
BINDER ABD UNIV 10 28-50 (GAUZE/BANDAGES/DRESSINGS) ×1 IMPLANT
BINDER ABD UNIV 12 45-62 (WOUND CARE) ×1 IMPLANT
BINDER ABDOM UNIV 10 (GAUZE/BANDAGES/DRESSINGS) ×3
BINDER ABDOMINAL 46IN 62IN (WOUND CARE) ×3
CANISTER SUCTION 2500CC (MISCELLANEOUS) IMPLANT
CHLORAPREP W/TINT 26ML (MISCELLANEOUS) ×3 IMPLANT
CLIP APPLIE ROT 10 11.4 M/L (STAPLE) IMPLANT
COVER SURGICAL LIGHT HANDLE (MISCELLANEOUS) ×3 IMPLANT
DEVICE SECURE STRAP 25 ABSORB (INSTRUMENTS) ×6 IMPLANT
DEVICE TROCAR PUNCTURE CLOSURE (ENDOMECHANICALS) ×3 IMPLANT
DRAPE LAPAROSCOPIC ABDOMINAL (DRAPES) ×3 IMPLANT
ELECT REM PT RETURN 9FT ADLT (ELECTROSURGICAL) ×3
ELECTRODE REM PT RTRN 9FT ADLT (ELECTROSURGICAL) ×1 IMPLANT
GAUZE SPONGE 2X2 8PLY STRL LF (GAUZE/BANDAGES/DRESSINGS) ×1 IMPLANT
GLOVE BIO SURGEON STRL SZ7.5 (GLOVE) ×3 IMPLANT
GLOVE BIOGEL PI IND STRL 7.0 (GLOVE) ×1 IMPLANT
GLOVE BIOGEL PI IND STRL 7.5 (GLOVE) ×4 IMPLANT
GLOVE BIOGEL PI INDICATOR 7.0 (GLOVE) ×2
GLOVE BIOGEL PI INDICATOR 7.5 (GLOVE) ×8
GLOVE SURG ORTHO 8.0 STRL STRW (GLOVE) ×3 IMPLANT
GLOVE SURG SS PI 7.0 STRL IVOR (GLOVE) ×3 IMPLANT
GOWN STRL REUS W/ TWL LRG LVL3 (GOWN DISPOSABLE) ×2 IMPLANT
GOWN STRL REUS W/ TWL XL LVL3 (GOWN DISPOSABLE) ×1 IMPLANT
GOWN STRL REUS W/TWL LRG LVL3 (GOWN DISPOSABLE) ×6
GOWN STRL REUS W/TWL XL LVL3 (GOWN DISPOSABLE) ×3
KIT BASIN OR (CUSTOM PROCEDURE TRAY) ×3 IMPLANT
KIT ROOM TURNOVER OR (KITS) ×3 IMPLANT
MARKER SKIN DUAL TIP RULER LAB (MISCELLANEOUS) ×3 IMPLANT
MESH VENTRALIGHT ST 6IN CRC (Mesh General) ×3 IMPLANT
NEEDLE SPNL 22GX3.5 QUINCKE BK (NEEDLE) ×3 IMPLANT
NS IRRIG 1000ML POUR BTL (IV SOLUTION) ×3 IMPLANT
PAD ARMBOARD 7.5X6 YLW CONV (MISCELLANEOUS) ×6 IMPLANT
SCALPEL HARMONIC ACE (MISCELLANEOUS) IMPLANT
SCISSORS LAP 5X35 DISP (ENDOMECHANICALS) IMPLANT
SET IRRIG TUBING LAPAROSCOPIC (IRRIGATION / IRRIGATOR) IMPLANT
SLEEVE ENDOPATH XCEL 5M (ENDOMECHANICALS) ×6 IMPLANT
SPONGE GAUZE 2X2 STER 10/PKG (GAUZE/BANDAGES/DRESSINGS) ×2
STAPLER VISISTAT 35W (STAPLE) ×3 IMPLANT
SUT MNCRL AB 4-0 PS2 18 (SUTURE) ×3 IMPLANT
SUT NOVA NAB DX-16 0-1 5-0 T12 (SUTURE) ×6 IMPLANT
TACKER 5MM HERNIA 3.5CML NAB (ENDOMECHANICALS) ×3 IMPLANT
TOWEL OR 17X24 6PK STRL BLUE (TOWEL DISPOSABLE) ×3 IMPLANT
TOWEL OR 17X26 10 PK STRL BLUE (TOWEL DISPOSABLE) ×3 IMPLANT
TRAY FOLEY CATH 16FR SILVER (SET/KITS/TRAYS/PACK) ×3 IMPLANT
TRAY LAPAROSCOPIC MC (CUSTOM PROCEDURE TRAY) ×3 IMPLANT
TROCAR XCEL BLUNT TIP 100MML (ENDOMECHANICALS) IMPLANT
TROCAR XCEL NON-BLD 11X100MML (ENDOMECHANICALS) ×3 IMPLANT
TROCAR XCEL NON-BLD 5MMX100MML (ENDOMECHANICALS) ×3 IMPLANT
TUBING INSUFFLATION (TUBING) ×3 IMPLANT
WATER STERILE IRR 1000ML POUR (IV SOLUTION) IMPLANT

## 2015-11-02 NOTE — Progress Notes (Signed)
Patient reports drinking 6-8 ounces of water at 0715.  Dr. Randa EvensEdwards made aware.

## 2015-11-02 NOTE — Anesthesia Postprocedure Evaluation (Signed)
Anesthesia Post Note  Patient: Colin Contreras  Procedure(s) Performed: Procedure(s) (LRB): LAPAROSCOPIC VENTRAL HERNIA WITH MESH (N/A)  Patient location during evaluation: PACU Anesthesia Type: General Level of consciousness: awake Vital Signs Assessment: post-procedure vital signs reviewed and stable Cardiovascular status: stable Anesthetic complications: no    Last Vitals:  Filed Vitals:   11/02/15 1245 11/02/15 1442  BP: 127/82 128/80  Pulse: 67 71  Temp: 36.4 C 37.3 C  Resp: 17 18    Last Pain:  Filed Vitals:   11/02/15 1444  PainSc: 7                  EDWARDS,Brendy Ficek

## 2015-11-02 NOTE — Brief Op Note (Signed)
11/02/2015  11:51 AM  PATIENT:  Colin Contreras  44 y.o. male  PRE-OPERATIVE DIAGNOSIS:  RECURRENT VENTRAL HERNIA  POST-OPERATIVE DIAGNOSIS:  RECURRENT VENTRAL HERNIA  PROCEDURE:  Procedure(s): LAPAROSCOPIC VENTRAL HERNIA WITH MESH (N/A)  SURGEON:  Surgeon(s) and Role:    * Darnell Levelodd Yee Gangi, MD - Primary  ASSISTANTS:  Magnus IvanSheila Bowman, RNFA   ANESTHESIA:   general  EBL:  Total I/O In: 1000 [I.V.:1000] Out: -   BLOOD ADMINISTERED:none  DRAINS: none   LOCAL MEDICATIONS USED:  MARCAINE     SPECIMEN:  No Specimen  DISPOSITION OF SPECIMEN:  N/A  COUNTS:  YES  TOURNIQUET:  * No tourniquets in log *  DICTATION: .Other Dictation: Dictation Number [id number]  PLAN OF CARE: Admit for overnight observation  PATIENT DISPOSITION:  PACU - hemodynamically stable.   Delay start of Pharmacological VTE agent (>24hrs) due to surgical blood loss or risk of bleeding: yes  Velora Hecklerodd M. Ximena Todaro, MD, [facility] Office: [phone]

## 2015-11-02 NOTE — Progress Notes (Signed)
On assessment, patient was found to have a foley catheter present. According to what is documented on the flowsheet, the foley has been removed. PACU called to clarify, nurse that gave report has left for a few hours.

## 2015-11-02 NOTE — Op Note (Signed)
Colin Contreras, KNUTZEN           ACCOUNT NO.:  [id number]  MEDICAL RECORD NO.:  [id number]  LOCATION:  6N32C                        FACILITY:  [facility]  PHYSICIAN:  Colin Heckler, MD      DATE OF BIRTH:  Jun 17, 1971  DATE OF PROCEDURE:  11/02/2015                              OPERATIVE REPORT   PREOPERATIVE DIAGNOSIS:  Recurrent ventral hernia.  POSTOPERATIVE DIAGNOSIS:  Recurrent ventral hernia.  PROCEDURE:  Laparoscopic ventral hernia repair with mesh (Ventralex ST 15 cm circular).  SURGEON:  Colin Heckler, MD, FACS  ASSISTANT:  Magnus Ivan, RNFA.  ANESTHESIA:  General.  ESTIMATED BLOOD LOSS:  Minimal.  PREPARATION:  ChloraPrep.  COMPLICATIONS:  None.  INDICATIONS:  The patient is a 44 year old male, who had undergone previous umbilical hernia repair with mesh patch.  The patient has now developed a hernia, just cephalad to his prior repair.  CT scan shows incarcerated fatty tissue without the presence of bowel.  The patient now comes to Surgery for repair.  BODY OF REPORT:  Procedure was done in OR #9 at the [facility].  The patient was brought to the operating room, placed in supine position on the operating room table.  Following administration of general anesthesia, the patient was positioned and then prepped and draped in the usual aseptic fashion.  After ascertaining that an adequate level of anesthesia had been achieved, an incision was made at the left costal margin.  Using a 5 mm Optiview trocar, the laparoscope was passed through the abdominal wall and into the peritoneal cavity safely.  Abdomen was insufflated of carbon dioxide.  Using the laparoscope, a second operative port was placed in the left lower quadrant.  With gentle retraction, the hernia is reduced. The fascial defect measures less than 2 cm in diameter, is in the midline, and is immediately above the patch for the prior umbilical hernia repair.  It was difficult to tell  whether this is a recurrence of the umbilical hernia or a second hernia defect in the midline.  A 15 cm diameter Ventralex ST mesh patch was selected for the repair. Patch is prepared by marking 6 equidistant points along the circumference of the patch on the abdominal wall.  Sutures were placed at each point being #1 Novafil sutures.  They are cut to the appropriate length.  Two additional trocars were placed in the right lower quadrant, right upper quadrant under direct vision.  Mesh was moistened with saline, rolled, and inserted through the 11 mm trocar in the right lower quadrant into the peritoneal cavity and deployed in its proper orientation.  Using the EndoCatch, incisions were made at each of the 6 points on the abdominal wall.  EndoCatch was used to retrieve the suture tags and bring them through the abdominal wall.  All sutures were then pulled taut and the mesh was elevated against the anterior abdominal wall with wide overlap from the hernia defect and good approximation. Sutures were tied securely.  Using a secured strap tacking device, two concentric rows of tacks were placed around the periphery of the mesh with again good approximation to the anterior abdominal wall and brought overlap of the abdominal wall outside of  the margins of the hernia defect.  Good hemostasis was noted.  Pneumoperitoneum was released and all ports were removed under direct vision.  Pneumoperitoneum was evacuated.  Port sites were anesthetized with local anesthetic.  Port sites were closed with interrupted 4-0 Monocryl subcuticular sutures. Wounds were washed and dried and benzoin and Steri-Strips were applied to all incisions.  Dry gauze dressings were applied.  A 12-inch abdominal binder was applied, prior to leaving the operating room.  The patient was awakened from anesthesia and brought to the recovery room. The patient tolerated the procedure well.   Colin Hecklerodd M. Pragya Lofaso, MD, [facility] Office: [phone]    TMG/MEDQ  D:  11/02/2015  T:  11/02/2015  Job:  [id number]

## 2015-11-02 NOTE — Interval H&P Note (Signed)
History and Physical Interval Note:  11/02/2015 9:55 AM  Colin Contreras  has presented today for surgery, with the diagnosis of RECURRENT VENTRAL HERNIA.  The various methods of treatment have been discussed with the patient and family. After consideration of risks, benefits and other options for treatment, the patient has consented to    Procedure(s): LAPAROSCOPIC VENTRAL HERNIA WITH MESH (N/A) as a surgical intervention .    The patient's history has been reviewed, patient examined, no change in status, stable for surgery.  I have reviewed the patient's chart and labs.  Questions were answered to the patient's satisfaction.    Velora Hecklerodd M. Christinea Brizuela, MD, [facility] Office: [phone]    Alejos Reinhardt MJudie Petit

## 2015-11-02 NOTE — Anesthesia Preprocedure Evaluation (Addendum)
Anesthesia Evaluation  Patient identified by MRN, date of birth, ID band Patient awake    Reviewed: Allergy & Precautions, NPO status , Patient's Chart, lab work & pertinent test results  Airway Mallampati: II  TM Distance: >3 FB Neck ROM: Full    Dental  (+) Teeth Intact   Pulmonary former smoker,    breath sounds clear to auscultation       Cardiovascular negative cardio ROS   Rhythm:Regular Rate:Normal     Neuro/Psych    GI/Hepatic Neg liver ROS, GI history noted. CE   Endo/Other    Renal/GU negative Renal ROS     Musculoskeletal   Abdominal   Peds  Hematology   Anesthesia Other Findings   Reproductive/Obstetrics                            Anesthesia Physical Anesthesia Plan  ASA: III  Anesthesia Plan: General   Post-op Pain Management:    Induction: Intravenous  Airway Management Planned: Oral ETT  Additional Equipment:   Intra-op Plan:   Post-operative Plan: Extubation in OR  Informed Consent: I have reviewed the patients History and Physical, chart, labs and discussed the procedure including the risks, benefits and alternatives for the proposed anesthesia with the patient or authorized representative who has indicated his/her understanding and acceptance.   Dental advisory given  Plan Discussed with: CRNA and Anesthesiologist  Anesthesia Plan Comments:         Anesthesia Quick Evaluation

## 2015-11-02 NOTE — Anesthesia Procedure Notes (Signed)
Procedure Name: Intubation Date/Time: 11/02/2015 10:27 AM Performed by: Colin Contreras, Colin Contreras E Pre-anesthesia Checklist: Patient identified, Emergency Drugs available, Suction available, Patient being monitored and Timeout performed Patient Re-evaluated:Patient Re-evaluated prior to inductionOxygen Delivery Method: Circle system utilized Preoxygenation: Pre-oxygenation with 100% oxygen Intubation Type: IV induction, Cricoid Pressure applied and Rapid sequence Laryngoscope Size: Mac and 4 Grade View: Grade II Tube type: Oral Tube size: 7.5 mm Number of attempts: 1 Airway Equipment and Method: Stylet Placement Confirmation: ETT inserted through vocal cords under direct vision,  positive ETCO2 and breath sounds checked- equal and bilateral Secured at: 22 cm Tube secured with: Tape Dental Injury: Teeth and Oropharynx as per pre-operative assessment

## 2015-11-02 NOTE — Transfer of Care (Signed)
Immediate Anesthesia Transfer of Care Note  Patient: Colin RossettiDaniel J Hagarty  Procedure(s) Performed: Procedure(s): LAPAROSCOPIC VENTRAL HERNIA WITH MESH (N/A)  Patient Location: PACU  Anesthesia Type:General  Level of Consciousness: awake, alert  and oriented  Airway & Oxygen Therapy: Patient Spontanous Breathing and Patient connected to nasal cannula oxygen  Post-op Assessment: Report given to RN, Post -op Vital signs reviewed and stable and Patient moving all extremities X 4  Post vital signs: Reviewed and stable  Last Vitals:  Filed Vitals:   11/02/15 0834  BP: 120/73  Pulse: 70  Temp: 36.3 C  Resp: 20    Complications: No apparent anesthesia complications

## 2015-11-03 ENCOUNTER — Encounter (HOSPITAL_COMMUNITY): Payer: Self-pay | Admitting: General Practice

## 2015-11-03 DIAGNOSIS — K430 Incisional hernia with obstruction, without gangrene: Secondary | ICD-10-CM | POA: Diagnosis not present

## 2015-11-03 HISTORY — PX: LAPAROSCOPIC ASSISTED VENTRAL HERNIA REPAIR: SHX6312

## 2015-11-03 MED ORDER — DOCUSATE SODIUM 100 MG PO CAPS
100.0000 mg | ORAL_CAPSULE | Freq: Two times a day (BID) | ORAL | Status: DC
  Administered 2015-11-03 – 2015-11-05 (×5): 100 mg via ORAL
  Filled 2015-11-03 (×5): qty 1

## 2015-11-03 MED ORDER — KETOROLAC TROMETHAMINE 15 MG/ML IJ SOLN
15.0000 mg | Freq: Four times a day (QID) | INTRAMUSCULAR | Status: DC
  Administered 2015-11-03 – 2015-11-04 (×7): 15 mg via INTRAVENOUS
  Filled 2015-11-03 (×8): qty 1

## 2015-11-03 NOTE — Progress Notes (Signed)
Patient was up from bed and sat into recliner around 0430 and as of this writing with CNA and RN assisting.  Patient burped as soon as he sat on the edge of bed but did not pass flatus yet. Spouse inside bedroom.  Patient resting comfortably on recliner

## 2015-11-03 NOTE — Progress Notes (Signed)
Patient ID: Colin Contreras, male   DOB: 30-Jan-1971, 44 y.o.   MRN: [id number]  General Surgery [facility]  POD#: 1  Subjective: Patient up in chair, wife at bedside. Some nausea last night.  Moderate pain.  Objective: Vital signs in last 24 hours: Temp:  [96.6 F (35.9 C)-99.1 F (37.3 C)] 98 F (36.7 C) (12/20 0416) Pulse Rate:  [60-94] 60 (12/20 0416) Resp:  [13-20] 18 (12/20 0416) BP: (95-136)/(59-85) 95/59 mmHg (12/20 0416) SpO2:  [95 %-100 %] 95 % (12/20 0416) Weight:  [114.76 kg (253 lb)] 114.76 kg (253 lb) (12/19 0834) Last BM Date: 11/02/15  Intake/Output from previous day: 12/19 0701 - 12/20 0700 In: 2955 [P.O.:360; I.V.:2595] Out: 1125 [Urine:1100; Blood:25] Intake/Output this shift:    Physical Exam: HEENT - sclerae clear, mucous membranes moist Neck - soft Chest - clear bilaterally Cor - RRR Abdomen - soft, binder on, dry and intact Ext - no edema, non-tender Neuro - alert & oriented, no focal deficits  Lab Results:  No results for input(s): WBC, HGB, HCT, PLT in the last 72 hours. BMET No results for input(s): NA, K, CL, CO2, GLUCOSE, BUN, CREATININE, CALCIUM in the last 72 hours. PT/INR No results for input(s): LABPROT, INR in the last 72 hours. Comprehensive Metabolic Panel:    Component Value Date/Time   NA 139 09/01/2014 1025   K 4.2 09/01/2014 1025   CL 102 09/01/2014 1025   CO2 26 09/01/2014 1025   BUN 18 09/01/2014 1025   CREATININE 1.11 09/01/2014 1025   GLUCOSE 103* 09/01/2014 1025   CALCIUM 9.1 09/01/2014 1025    Studies/Results: No results found.  Anti-infectives: Anti-infectives    Start     Dose/Rate Route Frequency Ordered Stop   11/02/15 0600  ciprofloxacin (CIPRO) IVPB 400 mg     400 mg 200 mL/hr over 60 Minutes Intravenous To [facility]Stay Surgical 11/01/15 1546 11/02/15 1100      Assessment & Plans: Status post lap ventral hernia repair with mesh  Advance diet if tolerated today  Encouraged  OOB, ambulation  Foley out this AM  Add Toradol for pain control  Velora Hecklerodd M. Falynn Ailey, MD, [facility] Office: [phone]   Colin Contreras Judie PetitM 11/03/2015

## 2015-11-04 DIAGNOSIS — K430 Incisional hernia with obstruction, without gangrene: Secondary | ICD-10-CM | POA: Diagnosis not present

## 2015-11-04 MED ORDER — KETOROLAC TROMETHAMINE 10 MG PO TABS: 10.0000 mg | ORAL_TABLET | Freq: Four times a day (QID) | ORAL | Status: AC | PRN

## 2015-11-04 MED ORDER — OXYCODONE-ACETAMINOPHEN 5-325 MG PO TABS: 1.0000 | ORAL_TABLET | ORAL | Status: AC | PRN

## 2015-11-04 NOTE — Progress Notes (Signed)
Patient ID: Marda StalkerDaniel J Tormey, male   DOB: 08/24/1971, 44 y.o.   MRN: [id number]  General Surgery [facility]  POD#: 2  Subjective: Patient up in chair, not much sleep last night.  Wife at bedside.  Denies nausea or emesis.  Moderate pain.  Voiding.  Ambulated in hall once.  Objective: Vital signs in last 24 hours: Temp:  [97.9 F (36.6 C)-99.1 F (37.3 C)] 98.7 F (37.1 C) (12/21 0430) Pulse Rate:  [77-96] 85 (12/21 0430) Resp:  [18-19] 19 (12/21 0430) BP: (110-119)/(64-75) 110/64 mmHg (12/21 0430) SpO2:  [96 %-98 %] 98 % (12/21 0430) Last BM Date: 11/02/15  Intake/Output from previous day: 12/20 0701 - 12/21 0700 In: 3056.3 [P.O.:1200; I.V.:1856.3] Out: 1350 [Urine:1350] Intake/Output this shift:    Physical Exam: HEENT - sclerae clear, mucous membranes moist Neck - soft Chest - clear bilaterally Cor - RRR Abdomen - soft, dressings dry and intact; BS present Ext - no edema, non-tender Neuro - alert & oriented, no focal deficits  Lab Results:  No results for input(s): WBC, HGB, HCT, PLT in the last 72 hours. BMET No results for input(s): NA, K, CL, CO2, GLUCOSE, BUN, CREATININE, CALCIUM in the last 72 hours. PT/INR No results for input(s): LABPROT, INR in the last 72 hours. Comprehensive Metabolic Panel:    Component Value Date/Time   NA 139 09/01/2014 1025   K 4.2 09/01/2014 1025   CL 102 09/01/2014 1025   CO2 26 09/01/2014 1025   BUN 18 09/01/2014 1025   CREATININE 1.11 09/01/2014 1025   GLUCOSE 103* 09/01/2014 1025   CALCIUM 9.1 09/01/2014 1025    Studies/Results: No results found.  Anti-infectives: Anti-infectives    Start     Dose/Rate Route Frequency Ordered Stop   11/02/15 0600  ciprofloxacin (CIPRO) IVPB 400 mg     400 mg 200 mL/hr over 60 Minutes Intravenous To [facility]Stay Surgical 11/01/15 1546 11/02/15 1100      Assessment & Plans: Status post lap ventral hernia repair with mesh  Advancing to regular diet this  AM  Percocet and Toradol for pain control  Possibly home later today or tomorrow  Velora Hecklerodd M. Reo Portela, MD, [facility] Office: [phone]   Tila Millirons Judie PetitM 11/04/2015

## 2015-11-05 DIAGNOSIS — K430 Incisional hernia with obstruction, without gangrene: Secondary | ICD-10-CM | POA: Diagnosis not present

## 2015-11-05 MED ORDER — DIPHENHYDRAMINE HCL 25 MG PO CAPS: 25.0000 mg | ORAL_CAPSULE | Freq: Four times a day (QID) | ORAL | Status: DC | PRN

## 2015-11-05 NOTE — Discharge Summary (Signed)
Physician Discharge Summary [facility]  Patient ID: Colin Contreras MRN: [id number] DOB/AGE: 44-May-1972 44 y.o.  Admit date: 11/02/2015 Discharge date: 11/05/2015  Admission Diagnoses:  Recurrent ventral hernia  Discharge Diagnoses:  Principal Problem:   Ventral incisional hernia   Discharged Condition: good  Hospital Course: patient admitted after lap ventral hernia repair with mesh.  Post op course uneventful.  Pain controlled.  Diet advanced slowly to regular.  Activity increased as tolerated.  Prepared for discharge on POD#3.  Consults: None  Treatments: surgery: lap ventral hernia repair with mesh  Discharge Exam: Blood pressure 113/71, pulse 83, temperature 98.7 F (37.1 C), temperature source Oral, resp. rate 17, height  (1.88 m), weight 114.76 kg (253 lb), SpO2 95 %. HEENT - clear Neck - soft Chest - clear bilaterally Cor - RRR Abd - soft without distension; BS present; dressings dry and intact  Disposition: Home  Discharge Instructions    Diet - low sodium heart healthy    Complete by:  As directed      Discharge instructions    Complete by:  As directed   [facility]  LAPAROSCOPIC SURGERY:  POST-OP INSTRUCTIONS  Always review your discharge instruction sheet given to you by the facility where your surgery was performed.  A prescription for pain medication may be given to you upon discharge.  Take your pain medication as prescribed.  If narcotic pain medicine is not needed, then you may take acetaminophen (Tylenol) or ibuprofen (Advil) as needed.  Take your usually prescribed medications unless otherwise directed.  If you need a refill on your pain medication, please contact your pharmacy.  They will contact our office to request authorization. Prescriptions will not be filled after 5 P.M. or on weekends.  You should follow a light diet the first few days after arrival home, such as soup and crackers or  toast.  Be sure to include plenty of fluids daily.  Most patients will experience some swelling and bruising in the area of the incisions.  Ice packs will help.  Swelling and bruising can take several days to resolve.   It is common to experience some constipation after surgery.  Increasing fluid intake and taking a stool softener (such as Colace) will usually help or prevent this problem from occurring.  A mild laxative (Milk of Magnesia or Miralax) should be taken according to package instructions if there has been no bowel movement after 48 hours.  You will have steri-strips and a gauze dressing over your incisions.  You may remove the gauze bandage on the second day after surgery, and you may shower at that time.  Leave your steri-strips (small skin tapes) in place directly over the incision.  These strips should remain on the skin for 5-7 days and then be removed.  You may get them wet in the shower and pat them dry.  Any sutures or staples will be removed at the office during your follow-up visit.  ACTIVITIES:  You may resume regular (light) daily activities beginning the next day - such as daily self-care, walking, climbing stairs - gradually increasing activities as tolerated.  You may have sexual intercourse when it is comfortable.  Refrain from any heavy lifting or straining until approved by your doctor.  You may drive when you are no longer taking prescription pain medication, you can comfortably wear a seatbelt, and you can safely maneuver your car and apply brakes.  You should see your doctor in the  office for a follow-up appointment approximately 2-3 weeks after your surgery.  Make sure that you call for this appointment within a day or two after you arrive home to insure a convenient appointment time.  WHEN TO CALL YOUR DOCTOR: Fever over 101.0 Inability to urinate Continued bleeding from incision Increased pain, redness, or drainage from the incision Increasing abdominal  pain  The clinic staff is available to answer your questions during regular business hours.  Please don't hesitate to call and ask to speak to one of the nurses for clinical concerns.  If you have a medical emergency, go to the nearest emergency room or call 911.  [facility] is always on call for the hospital.  Velora Hecklerodd M. Tanique Matney, MD, [facility] Office: [phone] Toll Free:  [phone] FAX [phone]  Website: [email or web address]     Discharge wound care:    Complete by:  As directed   May remove gauze dressings today and begin showers.  Leave steristrips one week.  Wear abdominal binder as instructed.     Increase activity slowly    Complete by:  As directed             Medication List    TAKE these medications        cetirizine 10 MG tablet  Commonly known as:  ZYRTEC  Take 10 mg by mouth daily as needed for allergies.     ibuprofen 200 MG tablet  Commonly known as:  ADVIL,MOTRIN  Take 200 mg by mouth every 6 (six) hours as needed for fever, headache or mild pain.     ketorolac 10 MG tablet  Commonly known as:  TORADOL  Take 1 tablet (10 mg total) by mouth every 6 (six) hours as needed.     oxyCODONE-acetaminophen 5-325 MG tablet  Commonly known as:  ROXICET  Take 1-2 tablets by mouth every 4 (four) hours as needed for moderate pain.         Velora Hecklerodd M. Lajuan Godbee, MD, [facility] Office: [phone]   Signed: Velora HecklerGERKIN,Dilyn Smiles M 11/05/2015, 11:49 AM

## 2016-09-27 ENCOUNTER — Emergency Department (HOSPITAL_COMMUNITY): Payer: No Typology Code available for payment source

## 2016-09-27 ENCOUNTER — Encounter (HOSPITAL_COMMUNITY): Payer: Self-pay | Admitting: Emergency Medicine

## 2016-09-27 ENCOUNTER — Emergency Department (HOSPITAL_COMMUNITY)
Admission: EM | Admit: 2016-09-27 | Discharge: 2016-09-27 | Disposition: A | Discharge status: Home / Self Care | Payer: No Typology Code available for payment source | Attending: Emergency Medicine | Admitting: Emergency Medicine

## 2016-09-27 DIAGNOSIS — R0789 Other chest pain: Secondary | ICD-10-CM | POA: Insufficient documentation

## 2016-09-27 DIAGNOSIS — Z87891 Personal history of nicotine dependence: Secondary | ICD-10-CM | POA: Diagnosis not present

## 2016-09-27 DIAGNOSIS — R079 Chest pain, unspecified: Secondary | ICD-10-CM

## 2016-09-27 LAB — CBC WITH DIFFERENTIAL/PLATELET
BASOS ABS: 0 10*3/uL (ref 0.0–0.1)
BASOS PCT: 0 %
EOS ABS: 0.3 10*3/uL (ref 0.0–0.7)
EOS PCT: 3 %
HCT: 44.1 % (ref 39.0–52.0)
Hemoglobin: 15.8 g/dL (ref 13.0–17.0)
Lymphocytes Relative: 17 %
Lymphs Abs: 1.8 10*3/uL (ref 0.7–4.0)
MCH: 32.6 pg (ref 26.0–34.0)
MCHC: 35.8 g/dL (ref 30.0–36.0)
MCV: 91.1 fL (ref 78.0–100.0)
MONO ABS: 0.8 10*3/uL (ref 0.1–1.0)
Monocytes Relative: 8 %
Neutro Abs: 7.5 10*3/uL (ref 1.7–7.7)
Neutrophils Relative %: 72 %
PLATELETS: 251 10*3/uL (ref 150–400)
RBC: 4.84 MIL/uL (ref 4.22–5.81)
RDW: 12.5 % (ref 11.5–15.5)
WBC: 10.4 10*3/uL (ref 4.0–10.5)

## 2016-09-27 LAB — BASIC METABOLIC PANEL
ANION GAP: 6 (ref 5–15)
BUN: 25 mg/dL — ABNORMAL HIGH (ref 6–20)
CALCIUM: 9 mg/dL (ref 8.9–10.3)
CO2: 26 mmol/L (ref 22–32)
Chloride: 106 mmol/L (ref 101–111)
Creatinine, Ser: 1.14 mg/dL (ref 0.61–1.24)
GLUCOSE: 113 mg/dL — AB (ref 65–99)
Potassium: 4.2 mmol/L (ref 3.5–5.1)
SODIUM: 138 mmol/L (ref 135–145)

## 2016-09-27 LAB — D-DIMER, QUANTITATIVE (NOT AT ARMC): D DIMER QUANT: 0.69 ug{FEU}/mL — AB (ref 0.00–0.50)

## 2016-09-27 LAB — I-STAT TROPONIN, ED
TROPONIN I, POC: 0 ng/mL (ref 0.00–0.08)
Troponin i, poc: 0 ng/mL (ref 0.00–0.08)

## 2016-09-27 MED ORDER — IOPAMIDOL (ISOVUE-370) INJECTION 76%
INTRAVENOUS | Status: AC
  Administered 2016-09-27: 80 mL via INTRAVENOUS
  Filled 2016-09-27: qty 100

## 2016-09-27 MED ORDER — NITROGLYCERIN 0.4 MG SL SUBL
0.4000 mg | SUBLINGUAL_TABLET | SUBLINGUAL | Status: DC | PRN
Start: 2016-09-27 — End: 2016-09-27

## 2016-09-27 MED ORDER — ASPIRIN 81 MG PO CHEW
324.0000 mg | CHEWABLE_TABLET | Freq: Once | ORAL | Status: AC
  Administered 2016-09-27: 324 mg via ORAL
  Filled 2016-09-27: qty 4

## 2016-09-27 MED ORDER — MORPHINE SULFATE (PF) 4 MG/ML IV SOLN
4.0000 mg | Freq: Once | INTRAVENOUS | Status: AC
  Administered 2016-09-27: 4 mg via INTRAVENOUS
  Filled 2016-09-27: qty 1

## 2016-09-27 NOTE — ED Provider Notes (Signed)
TIME SEEN: 5:18 AM  CHIEF COMPLAINT: Chest pain  HPI: Pt is a 45 y.o. male with history of "borderline high cholesterol", history of intermittent tobacco use who presents to the emergency department with complaints of left-sided chest pain. Described as a pressure, tightness that radiates down his left arm. Also feel short of breath and states he feels like it hurts to take a deep breath. Pain does not appear to be exertional. No nausea, vomiting, diaphoresis or dizziness. Pain woke him from sleep at 2:30 AM. Had an episode of similar symptoms in 2015 with negative workup. Denies history of stress test, cardiac catheterization. Denies history of PE or DVT. No lower extremity swelling or pain. No fevers or cough.  ROS: See HPI Constitutional: no fever  Eyes: no drainage  ENT: no runny nose   Cardiovascular:   chest pain  Resp:  SOB  GI: no vomiting GU: no dysuria Integumentary: no rash  Allergy: no hives  Musculoskeletal: no leg swelling  Neurological: no slurred speech ROS otherwise negative  PAST MEDICAL HISTORY/PAST SURGICAL HISTORY:  Past Medical History:  Diagnosis Date  . Chronic lower back pain   . Sleep apnea    "dx'd; never followed up" (11/03/2015)    MEDICATIONS:  Prior to Admission medications   Medication Sig Start Date End Date Taking? Authorizing Provider  cetirizine (ZYRTEC) 10 MG tablet Take 10 mg by mouth daily as needed for allergies.    Historical Provider, MD  ibuprofen (ADVIL,MOTRIN) 200 MG tablet Take 200 mg by mouth every 6 (six) hours as needed for fever, headache or mild pain.     Historical Provider, MD  ketorolac (TORADOL) 10 MG tablet Take 1 tablet (10 mg total) by mouth every 6 (six) hours as needed. 11/04/15   Darnell Levelodd Gerkin, MD  oxyCODONE-acetaminophen (ROXICET) 5-325 MG tablet Take 1-2 tablets by mouth every 4 (four) hours as needed for moderate pain. 11/04/15   Darnell Levelodd Gerkin, MD    ALLERGIES:  Allergies  Allergen Reactions  . Penicillins    Childhood    SOCIAL HISTORY:  Social History  Substance Use Topics  . Smoking status: Former Games developermoker  . Smokeless tobacco: Never Used     Comment: "smoked some in college in 1996"  . Alcohol use Yes     Comment: 11/03/2015 "stopped drinking in November; haven't been a big drinker in a long time"    FAMILY HISTORY: Family History  Problem Relation Age of Onset  . Cancer Paternal Grandfather     colon  . Cancer Paternal Grandmother     lung    EXAM: Ht 6\' 2"  (1.88 m)   Wt 236 lb (107 kg)   BMI 30.30 kg/m  CONSTITUTIONAL: Alert and oriented and responds appropriately to questions. Well-appearing; well-nourished HEAD: Normocephalic EYES: Conjunctivae clear, PERRL, EOMI ENT: normal nose; no rhinorrhea; moist mucous membranes NECK: Supple, no meningismus, no nuchal rigidity, no LAD  CARD: RRR; S1 and S2 appreciated; no murmurs, no clicks, no rubs, no gallops CHEST:  Nontender to palpation without crepitus, ecchymosis or deformity. No rash or other lesions noted. RESP: Normal chest excursion without splinting or tachypnea; breath sounds clear and equal bilaterally; no wheezes, no rhonchi, no rales, no hypoxia or respiratory distress, speaking full sentences ABD/GI: Normal bowel sounds; non-distended; soft, non-tender, no rebound, no guarding, no peritoneal signs, no hepatosplenomegaly BACK:  The back appears normal and is non-tender to palpation, there is no CVA tenderness EXT: Normal ROM in all joints; non-tender to palpation; no edema;  normal capillary refill; no cyanosis, no calf tenderness or swelling    SKIN: Normal color for age and race; warm; no rash NEURO: Moves all extremities equally, sensation to light touch intact diffusely, cranial nerves II through XII intact, normal speech PSYCH: The patient's mood and manner are appropriate. Grooming and personal hygiene are appropriate.  MEDICAL DECISION MAKING: Patient here with chest pain.  HEART score is 3.  First EKG showed  baseline wander and a machine was reading it as ST elevation. We have immediately repeated and I have seen patient immediately upon being placed into a room. Repeat EKG shows no change compared to prior EKG in 2015 and there is no sign of ST elevation. Will obtain cardiac labs. Pain is also pleuritic in nature. Will obtain d-dimer. Will treat symptomatically. We'll give aspirin.  ED PROGRESS: Patient's first troponin 3 hours after onset of symptoms is negative. D-dimer mildly elevated. Will obtain a CT of his chest to rule out PE. Plan is to repeat a second troponin at 8:30 AM. This will be 6 hours after the onset of symptoms. Again he is so low risk that I feel if his workup here is negative, he can be discharged home with close outpatient follow-up. He does have a PCP. Patient and his wife have both been comfortable with this plan.  8:00 AM  Signed out to Dr. Eudelia Bunchardama to follow up on CT and 2nd troponin.  I reviewed all nursing notes, vitals, pertinent old records, EKGs, labs, imaging (as available).    EKG Interpretation  Date/Time:  Tuesday September 27 2016 05:20:56 EST Ventricular Rate:  65 PR Interval:  182 QRS Duration: 78 QT Interval:  410 QTC Calculation: 426 R Axis:   39 Text Interpretation:  Normal sinus rhythm Low voltage QRS Baseline wander Needs repeat EKG No significant change since last tracing Confirmed by Keshona Kartes,  DO, Anthone Prieur [phone]([id number]) on 09/27/2016 5:20:02 AM        EKG Interpretation  Date/Time:  Tuesday September 27 2016 05:21:53 EST Ventricular Rate:  71 PR Interval:  182 QRS Duration: 89 QT Interval:  404 QTC Calculation: 439 R Axis:   -4 Text Interpretation:  Sinus rhythm Borderline low voltage, extremity leads No significant change since last tracing in 2015 Confirmed by Jenesa Foresta,  DO, Tariana Moldovan [phone]([id number]) on 09/27/2016 5:25:48 AM         Layla MawKristen N Remo Kirschenmann, DO 09/27/16 [phone]

## 2016-09-27 NOTE — ED Triage Notes (Signed)
Per pt, chest pressure began about 0230 this am, pain radiates to the left arm. Pt woken up from sleep by pain. Endorses shortness of breath. Denies nausea/diaphoresis/dizziness.

## 2016-09-27 NOTE — ED Notes (Signed)
Pt ambulated to and from restroom; tolerated well 

## 2016-09-27 NOTE — ED Notes (Signed)
Papers reviewed with patient and wife and they verbalize intent to follow up for a stress test as ordered by Wilhelmenia BlaseM D

## 2016-09-27 NOTE — ED Provider Notes (Signed)
I assumed care of this patient from Dr. Elesa MassedWard at 0700.  Please see their note for further details of Hx, PE.  Briefly patient is a 45 y.o. male with a Chest Pain Dr. Elesa MassedWard worse ruling out ACS and PE. EKG was unremarkable. Initial troponin negative. Currently awaiting Dr. Troponin. Patient dimer was elevated and is currently awaiting a CTA.  Plan is to follow-up results.  Delta troponin negative. CTA without evidence of pulmonary embolism. Additionally no evidence of pneumonia noted.  Safe for discharge with strict return precautions.  Disposition: Discharge  Condition: Good  I have discussed the results, Dx and Tx plan with the patient who expressed understanding and agree(s) with the plan. Discharge instructions discussed at great length. The patient was given strict return precautions who verbalized understanding of the instructions. No further questions at time of discharge.    Discharge Medication List as of 09/27/2016  9:52 AM      Follow Up: Marden Nobleobert Gates, MD [address] [place] [address] [phone]  Schedule an appointment as soon as possible for a visit    [facility] [address] Office [address], [address] [place] [id number] [phone] Schedule an appointment as soon as possible for a visit  to set up outpatient stress test        Nira ConnPedro Eduardo Cardama, MD 09/28/16 [phone]

## 2016-09-27 NOTE — ED Notes (Signed)
Pt taken to CT.

## 2018-05-03 IMAGING — CT CT ANGIO CHEST
2 of 9 series · 18 of 46 positions shown · IV contrast (APPLIED)
Comparison: Chest radiographs 09/01/2014. CT Abdomen and Pelvis
06/10/2015

CLINICAL DATA: 45-year-old male with acute onset chest pain at 2292
hours with shortness of breath. Abnormal D-dimer. Initial encounter.

EXAM:
CT ANGIOGRAPHY CHEST WITH CONTRAST
TECHNIQUE: Multidetector CT imaging of the chest was performed using the
standard protocol during bolus administration of intravenous
contrast. Multiplanar CT image reconstructions and MIPs were
obtained to evaluate the vascular anatomy.
CONTRAST:  80 mL Isovue 370

[Series 5: thins · axial · 0.79mm/px · z∈[+1144,+1352]mm · 15 of 234 slices shown]
[im 13/234  lung]
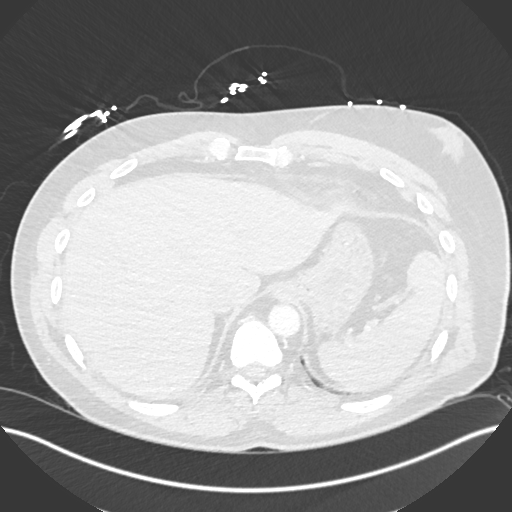
[im 26/234  soft-tissue]
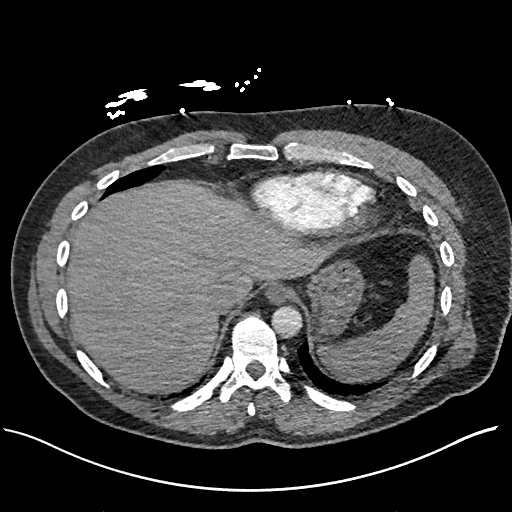
[im 39/234  lung]
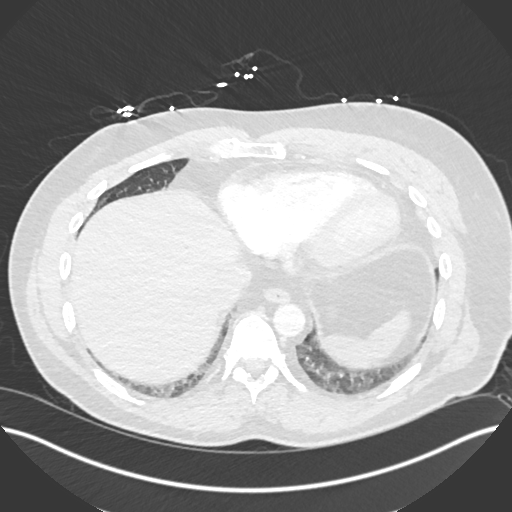
[im 52/234  soft-tissue]
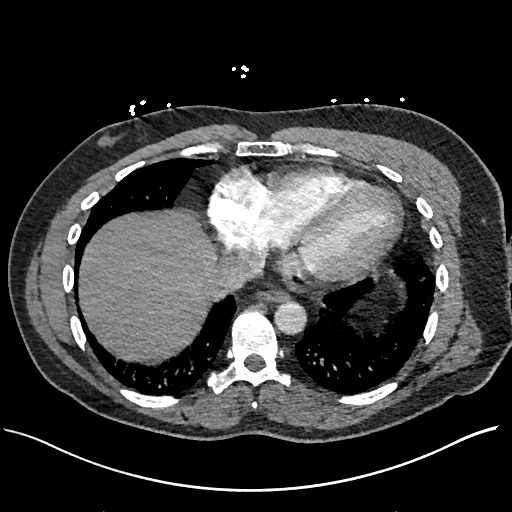
[im 78/234  lung]
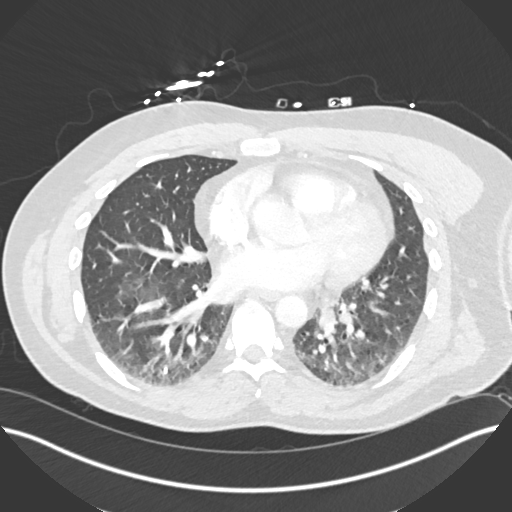
[im 91/234  soft-tissue]
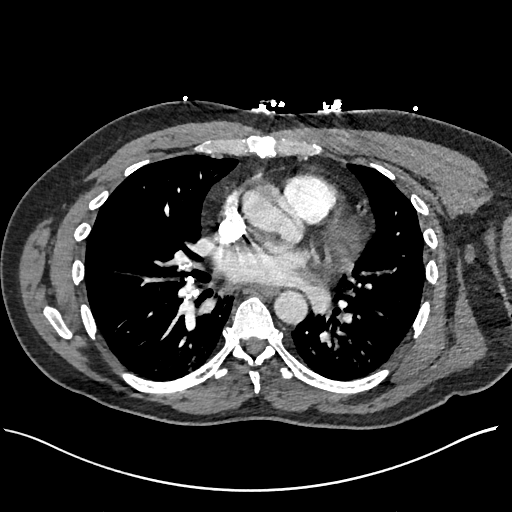
[im 104/234  lung]
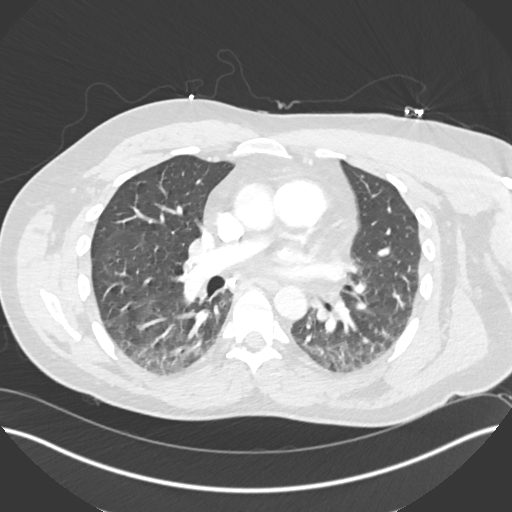
[im 117/234  soft-tissue]
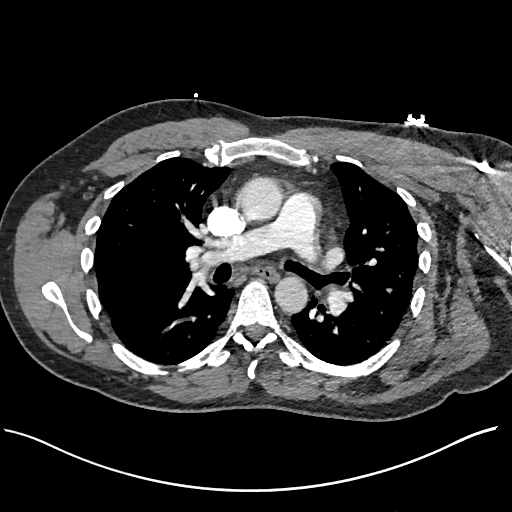
[im 130/234  lung]
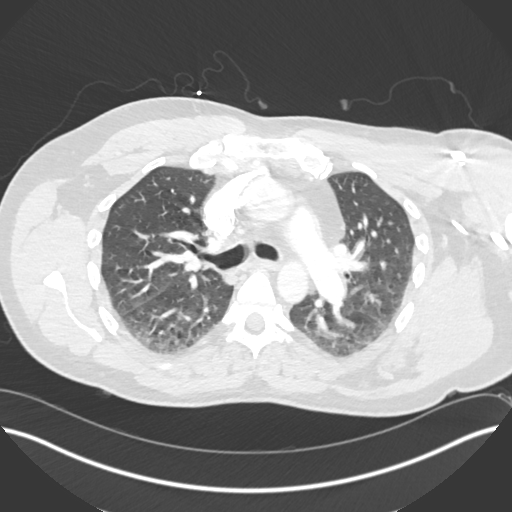
[im 143/234  soft-tissue]
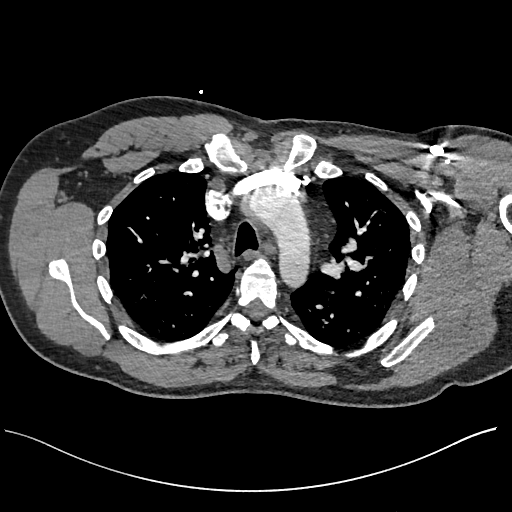
[im 156/234  lung]
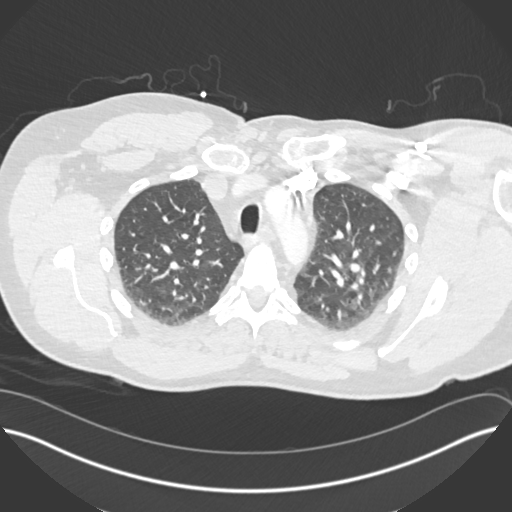
[im 182/234  soft-tissue]
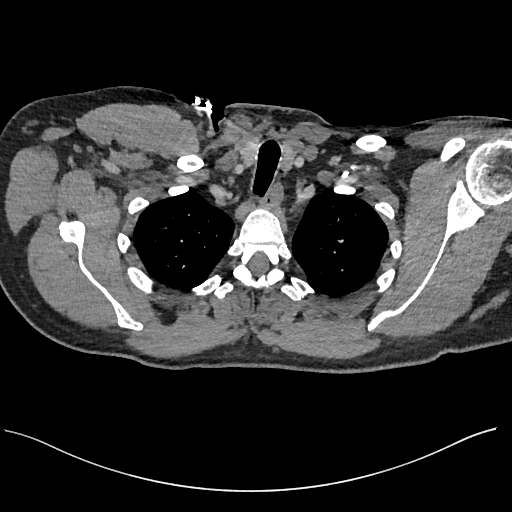
[im 195/234  lung]
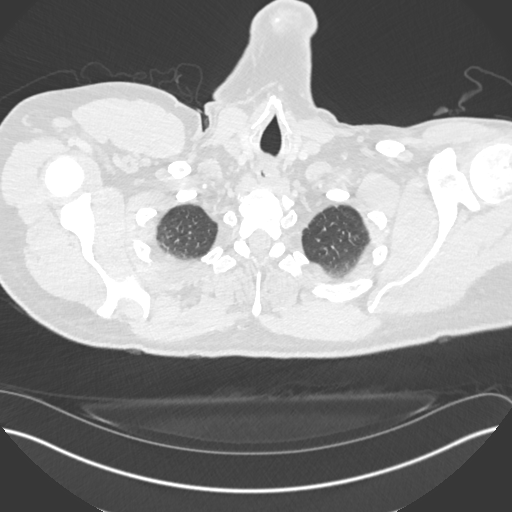
[im 208/234  soft-tissue]
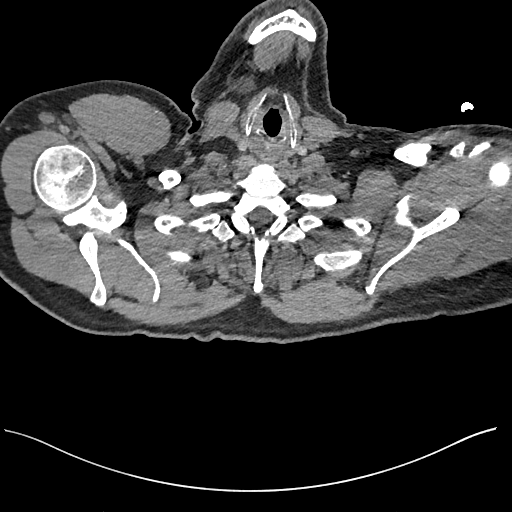
[im 221/234  lung]
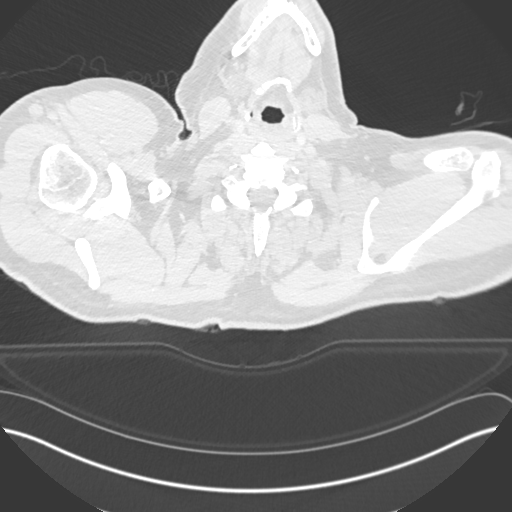

[Series 7: coronal mpr · coronal · 0.56mm/px · 3 of 151 slices shown]
[im 38/151  soft-tissue]
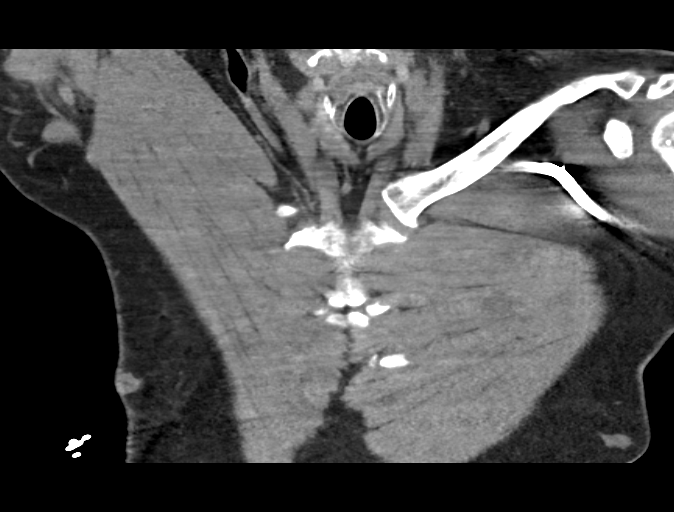
[im 76/151  soft-tissue]
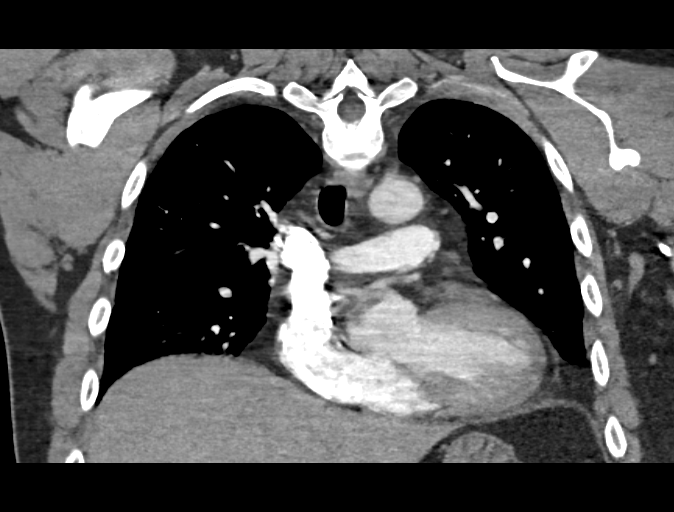
[im 113/151  soft-tissue]
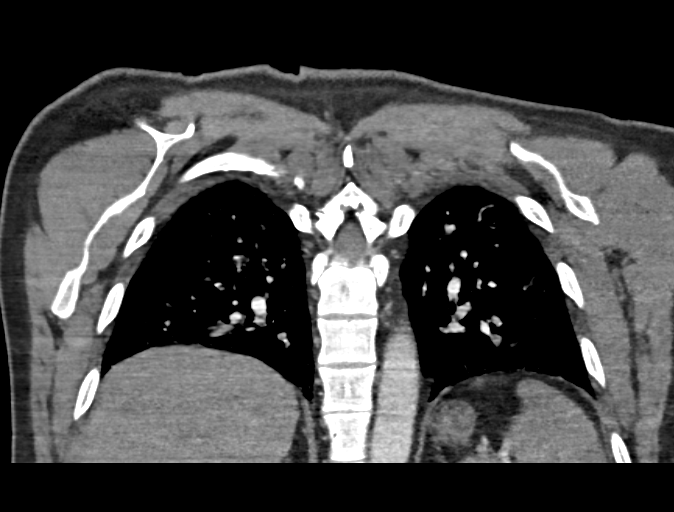

[18 of 46 positions shown; findings below may reference images not displayed]

FINDINGS: Cardiovascular: Suboptimal contrast bolus timing in the pulmonary
arterial tree. Mild lower lobe respiratory motion artifact. No
central or upper lobe pulmonary artery filling defect. The proximal
lower lobe pulmonary artery is also appear patent. The distal lower
lobe vessels are not evaluated.

No pericardial effusion. Negative visualized aorta. No calcified
coronary artery atherosclerosis is evident.

Mediastinum/Nodes: Multiple small calcified right hilar and
subcarinal lymph nodes. No mediastinal lymphadenopathy.

Lungs/Pleura: Major airways are patent. Occasional right lung
calcified granulomas. Lower lung volumes compared to the prior CT
Abdomen and Pelvis. Confluent but only dependent pulmonary opacity
in both lungs which most resembles atelectasis. No pleural effusion.
No consolidation.

Upper Abdomen: Negative visualized liver, spleen, and bowel in the
upper abdomen.

Musculoskeletal: No acute osseous abnormality identified.

Review of the MIP images confirms the above findings.
IMPRESSION: 1. Suboptimal exam with no pulmonary embolus identified.
2. Dependent pulmonary atelectasis and sequelae of prior right lung
granulomatous disease.
3. No other acute findings in the chest.

## 2019-09-20 ENCOUNTER — Other Ambulatory Visit: Payer: Self-pay

## 2019-09-20 DIAGNOSIS — Z20822 Contact with and (suspected) exposure to covid-19: Secondary | ICD-10-CM

## 2019-09-21 LAB — NOVEL CORONAVIRUS, NAA: SARS-CoV-2, NAA: NOT DETECTED

## 2024-09-18 ENCOUNTER — Other Ambulatory Visit (HOSPITAL_COMMUNITY): Payer: Self-pay | Admitting: Internal Medicine

## 2024-09-18 DIAGNOSIS — E7800 Pure hypercholesterolemia, unspecified: Secondary | ICD-10-CM

## 2024-10-07 ENCOUNTER — Other Ambulatory Visit (HOSPITAL_BASED_OUTPATIENT_CLINIC_OR_DEPARTMENT_OTHER)

## 2024-10-08 ENCOUNTER — Encounter (HOSPITAL_BASED_OUTPATIENT_CLINIC_OR_DEPARTMENT_OTHER): Payer: Self-pay

## 2024-10-08 ENCOUNTER — Other Ambulatory Visit (HOSPITAL_BASED_OUTPATIENT_CLINIC_OR_DEPARTMENT_OTHER)
